# Patient Record
Sex: Male | Born: 1950 | Race: White | Hispanic: No | Marital: Married | Smoking: Never smoker
Health system: Southern US, Community
[De-identification: ages and names within clinical notes are randomized; demographics above are authoritative.]

## PROBLEM LIST (undated history)

## (undated) DIAGNOSIS — K219 Gastro-esophageal reflux disease without esophagitis: Secondary | ICD-10-CM

## (undated) DIAGNOSIS — G4730 Sleep apnea, unspecified: Secondary | ICD-10-CM

## (undated) DIAGNOSIS — Z460 Encounter for fitting and adjustment of spectacles and contact lenses: Secondary | ICD-10-CM

## (undated) DIAGNOSIS — I10 Essential (primary) hypertension: Secondary | ICD-10-CM

## (undated) HISTORY — PX: COLONOSCOPY: SHX174

## (undated) HISTORY — PX: UPPER GASTROINTESTINAL ENDOSCOPY: SHX188

---

## 2001-07-29 HISTORY — PX: ESOPHAGEAL DILATION: SHX303

## 2002-01-08 ENCOUNTER — Encounter: Payer: Self-pay | Admitting: Family Medicine

## 2002-01-08 ENCOUNTER — Ambulatory Visit (HOSPITAL_COMMUNITY): Admission: RE | Admit: 2002-01-08 | Discharge: 2002-01-08 | Payer: Self-pay | Admitting: Family Medicine

## 2004-04-25 ENCOUNTER — Ambulatory Visit (HOSPITAL_COMMUNITY): Admission: RE | Admit: 2004-04-25 | Discharge: 2004-04-25 | Payer: Self-pay | Admitting: Internal Medicine

## 2005-09-02 IMAGING — CR DG ESOPHAGUS
12 of 24 series · 12 of 24 positions shown · non-contrast
Comparison: none

CLINICAL DATA: Epiphrenic diverticulum, dysphagia, abnormal endoscopy.

[run (1 of 12)]
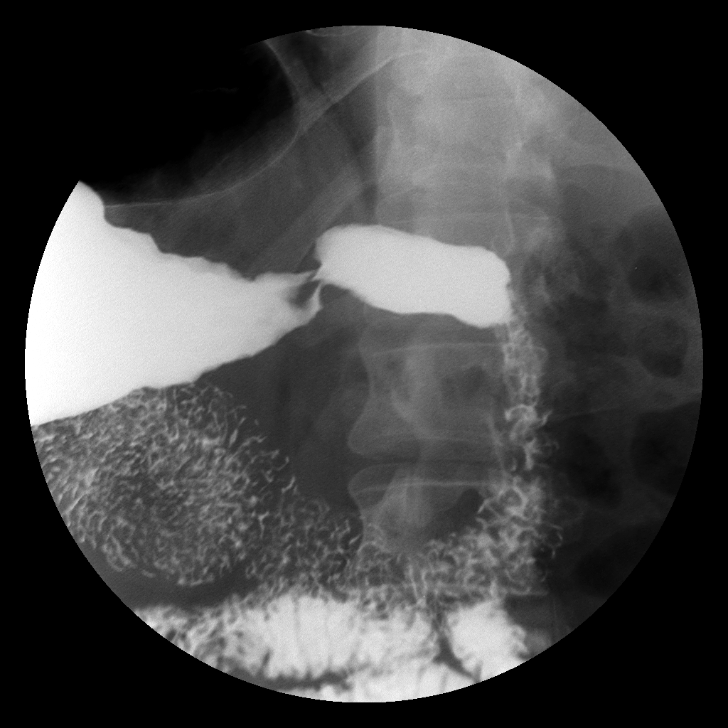

[run (2 of 12)]
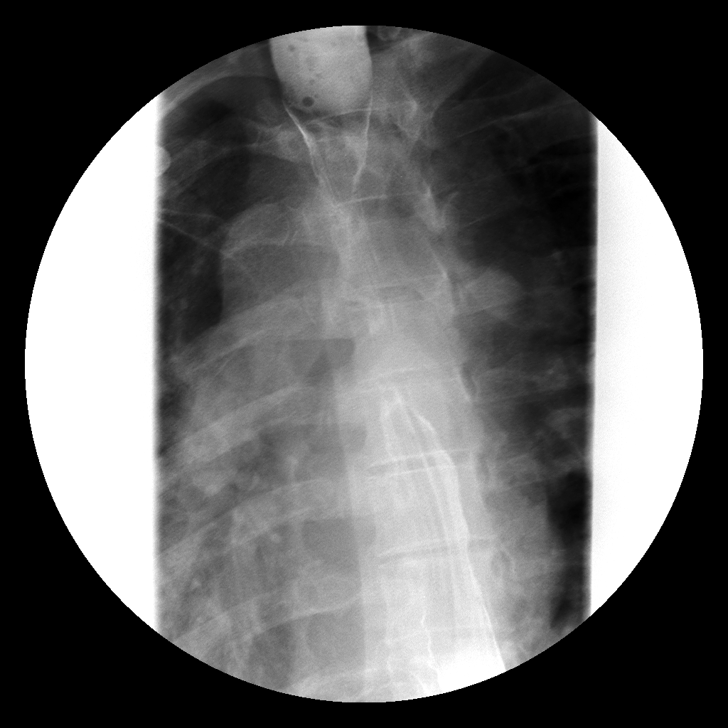

[run (3 of 12)]
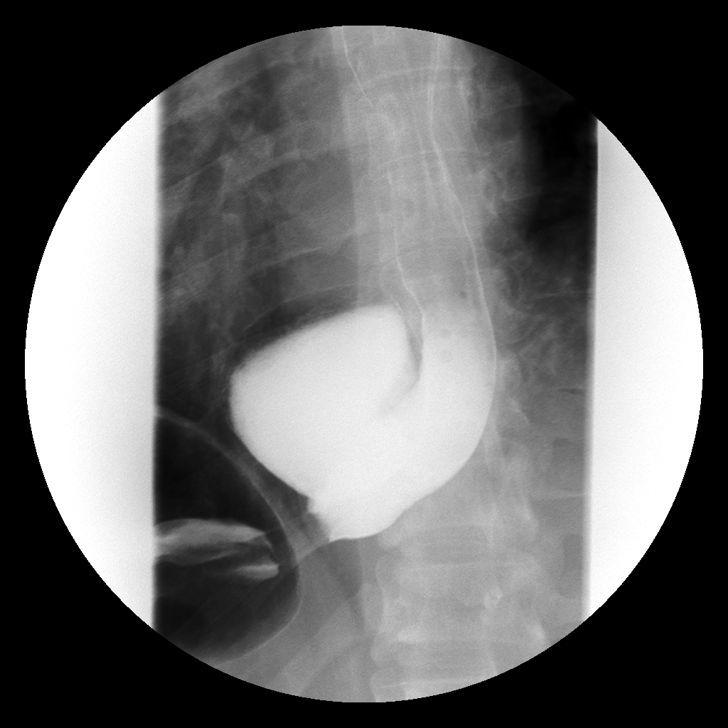

[run (4 of 12)]
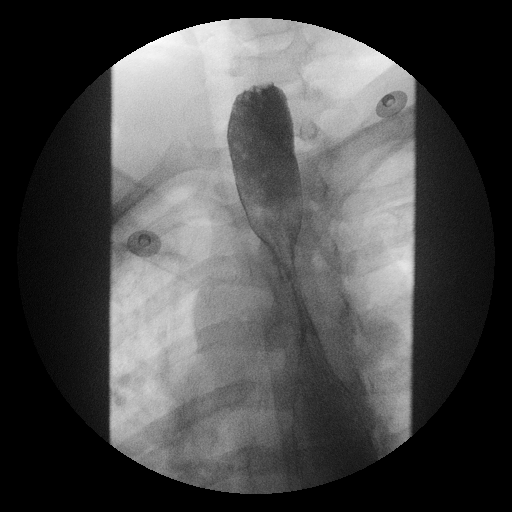

[run (5 of 12)]
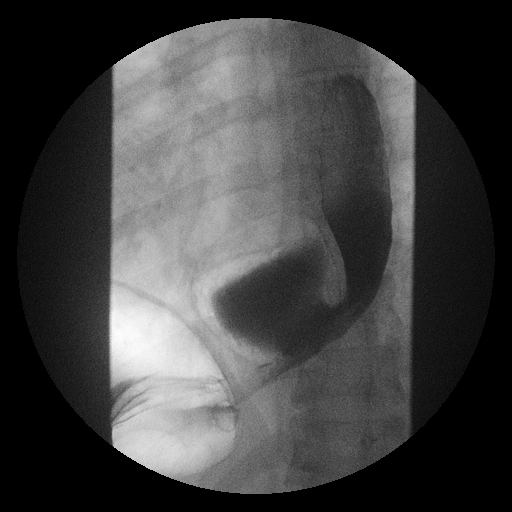

[run (6 of 12)]
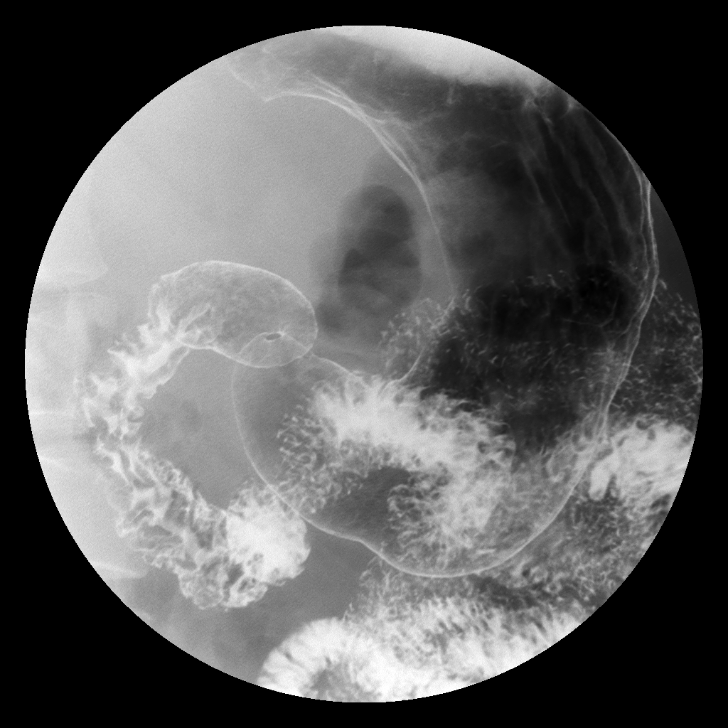

[run (7 of 12)]
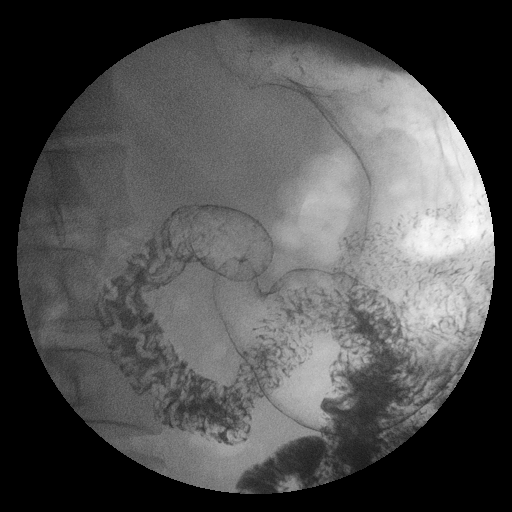

[run (8 of 12)]
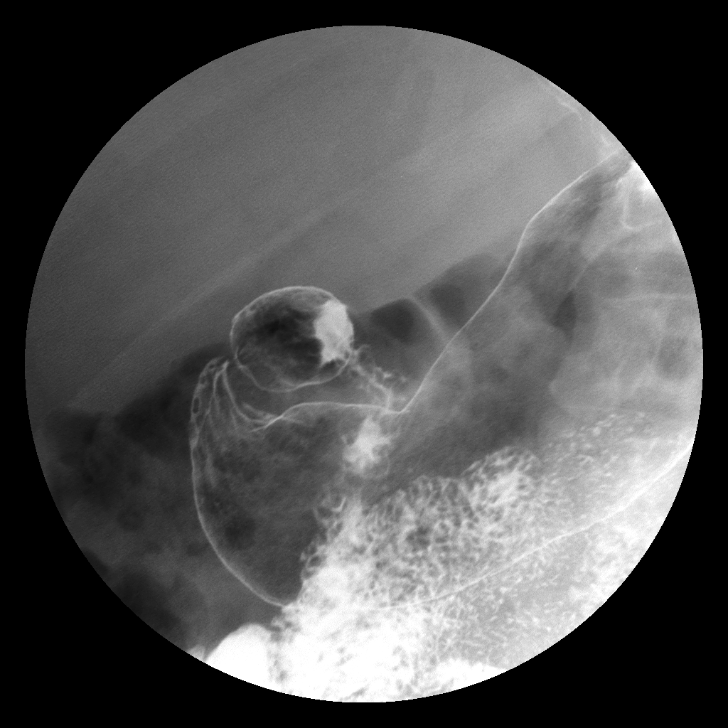

[run (9 of 12)]
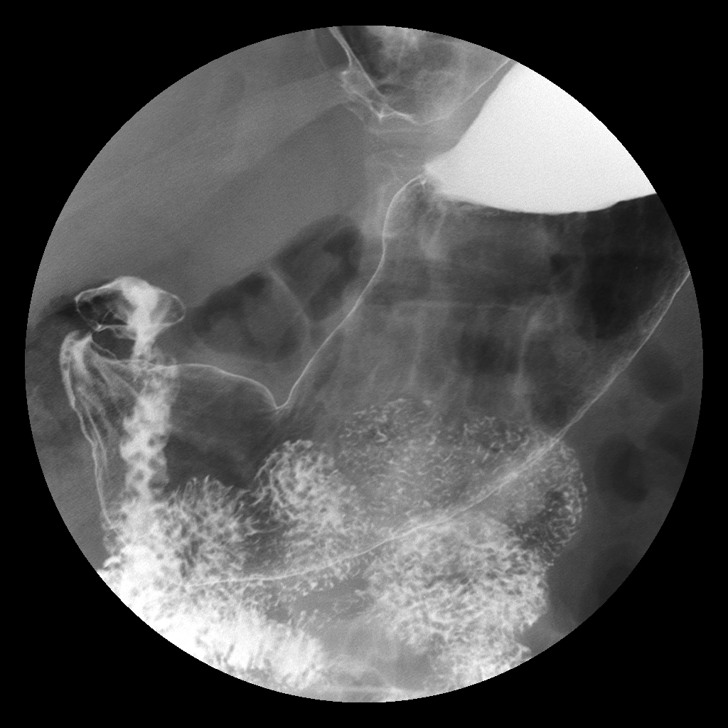

[run (10 of 12)]
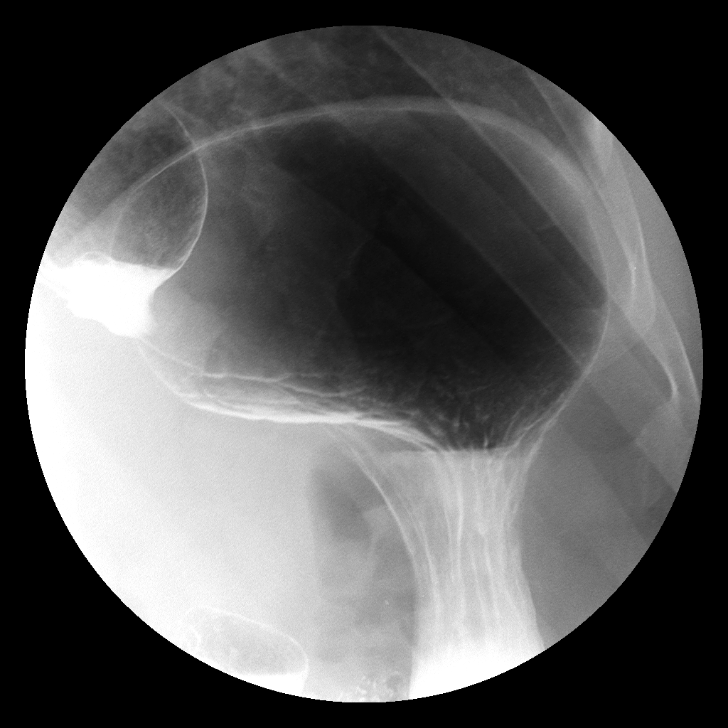

[run (11 of 12)]
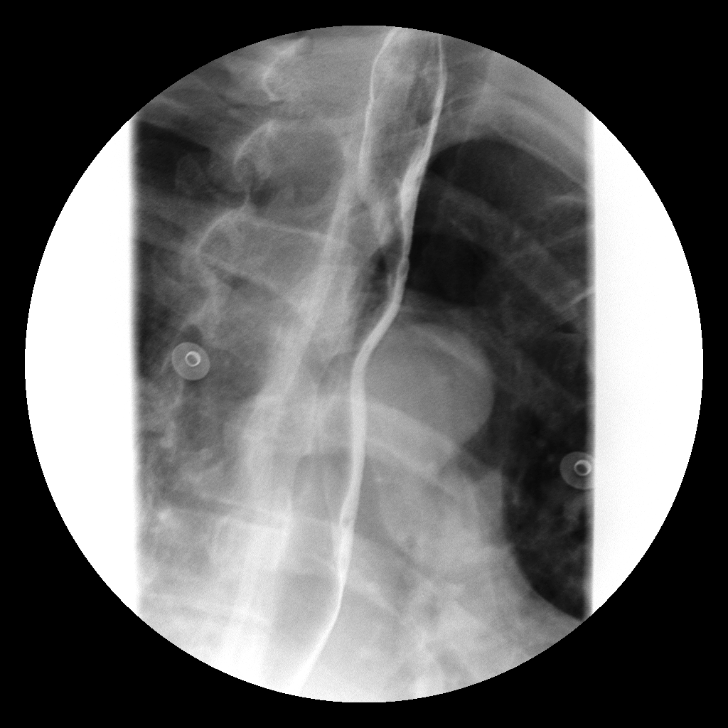

[run (12 of 12)]
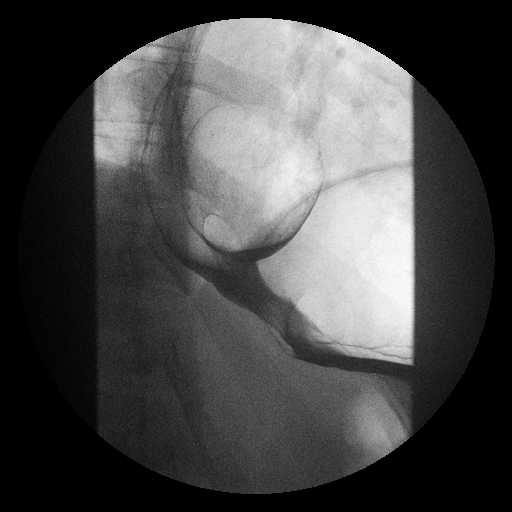

[12 of 24 positions shown; findings below may reference images not displayed]

ESOPHAGRAM

Single contrast esophagram performed.
Esophagus normal in caliber without stricture or obstruction.
12.5 mm diameter barium tablet passes from mouth to stomach without delay.
Large diverticulum distal esophagus, above the diaphragm, in which prominent barium air level is
identified. The barium tablet did not lodge within the diverticulum. No definite retained food
debris within diverticulum.
Mucosa appears smooth and regular and no signs of esophagitis seen.
No intraluminal filling defects within esophagus.
IMPRESSION: Large epiphrenic diverticulum. No esophageal obstruction.

UPPER GI EXAM

Stomach distends without mass or ulceration.
No gastric outlet obstruction.
Normal gastric rugal fold pattern.
Normal appearance duodenal bulb and sweep.
Visualized small bowel loops unremarkable.
IMPRESSION: Normal upper GI exam.

## 2008-07-18 ENCOUNTER — Emergency Department (HOSPITAL_COMMUNITY): Admission: EM | Admit: 2008-07-18 | Discharge: 2008-07-19 | Payer: Self-pay | Admitting: Emergency Medicine

## 2008-07-18 ENCOUNTER — Emergency Department (HOSPITAL_COMMUNITY): Admission: EM | Admit: 2008-07-18 | Discharge: 2008-07-18 | Payer: Self-pay | Admitting: Emergency Medicine

## 2010-12-11 NOTE — Op Note (Signed)
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   MEDICATIONS:  Fentanyl 75 mcg IV, Versed 8 mg IV.   FINDINGS:  Endoscope was inserted.  The oropharynx and esophagus  intubated.  There was scattered areas of foamy substances in the middle  and distal esophagus.  A large food bolus was noted to be blocking the  esophageal lumen of the distal esophagus.  A Roth Net was used to remove  this food bolus.  The endoscope was reinserted and advanced down to the  distal esophagus although there was one linear erythematous streak in  the vicinity of the previously seen food bolus.  There was also a  stricture in this area with two sutures/staples noted in the area of  this stricture.  The endoscope was advanced through the GE junction  without difficulty into the small hiatal hernia and down into the  stomach.  The stomach was normal in appearance.  Retroflexion was done  which revealed normal proximal stomach.  The scope was straightened and  advanced into the duodenal bulb and second portion of duodenum which  were both normal.  Endoscope was withdrawn back into the esophagus and  this distal esophageal stricture was again noted and appeared benign in  appearance.  Two sutures/staples were again visualized in the vicinity  of where the food bolus was.  On further withdrawal of the endoscope, a  nonobstructing circumferential esophageal ring was seen in the proximal  esophagus.  Endoscope was withdrawn to confirm above findings.   ASSESSMENT:  1. Food bolus  removed with food with Lucina Mellow Net in distal esophagus.  2. 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   PLAN:  1. Prilosec OTC 20 mg p.o. daily.  2. If symptoms persist, then the patient most likely will need      referral back to [facility] where he had his surgery since      stricture may not be amenable to endoscopic dilation due to      previous surgery.      Shirley Friar, MD  Electronically Signed     VCS/MEDQ  D:  07/18/2008  T:  07/19/2008  Job:  [id number]   cc:   R. Roetta Sessions, M.D.

## 2010-12-14 NOTE — H&P (Signed)
NAMEDELONTAE, LAMM                          ACCOUNT NO.:  [id number]   MEDICAL RECORD NO.:  [id number]                   PATIENT TYPE:   LOCATION:                                       FACILITY:   PHYSICIAN:  R. Roetta Sessions, M.D.              DATE OF BIRTH:  25-Dec-1950   DATE OF ADMISSION:  DATE OF DISCHARGE:                                HISTORY & PHYSICAL   CHIEF COMPLAINT:  Esophageal dysphagia.   HISTORY OF PRESENT ILLNESS:  This patient is a 60 year old Caucasian male,  [employer] stage manager, who came to see me with a 1-year history of  intermittent esophageal dysphagia to solids.  He describes having particular  difficulties with steak on an intermittent basis.  Sometimes liquids in  association with episodes of esophageal solid food dysphagia, no trouble  with pills.  Really has not had any reflux symptoms.  No true odynophagia.  Has not had any early satiety, nausea or vomiting  No melena or rectal  bleeding.  No weight loss.  No history of prior gastrointestinal disease.  He does not smoke or use tobacco products. Again, he has not lost any  weight.   PAST MEDICAL HISTORY:  Unremarkable except for high blood pressure.   CURRENT MEDICATIONS:  Hydrochlorothiazide 25 mg daily, SA daily, Lotrel caps  10/20 mg daily.   ALLERGIES:  No known drug allergies.   FAMILY HISTORY:  His father died of myocardial infarction.  Mother has  diabetes.  No history of chronic GI or liver illness.   SOCIAL HISTORY:  The patient is married has 2 step-sons.  He is a Engineer, maintenance.  No tobacco, no alcohol.   REVIEW OF SYSTEMS:  No fever, no chills, no change in weight.  No chest pain  or dyspnea.   PHYSICAL EXAMINATION:  GENERAL:  Reveals a pleasant, 60 year old gentleman  resting comfortably in no acute distress.  VITAL SIGNS:  Height 5 feet 9 inches.  BP 130/90, pulse 80.  SKIN:  Warm and dry.  HEENT:  No scleral icterus.  Oral cavity no lesions.  CHEST:  Lungs are  clear to auscultation.  CARDIAC EXAM:  Regular rate and rhythm without murmur, gallop, or rub.  ABDOMEN:  Nondistended, positive bowel sounds.  Soft, nontender, without  appreciable mass or organomegaly.   IMPRESSION:  This patient is a 60 year old gentleman with intermittent  esophageal dysphagia.  He most certainly has a Schatzki's ring, a peptic  stricture, or cervical web would be less likely in the differential.  There  is really no tobacco or alcohol history so a malignant process would be much  less likely.   RECOMMENDATIONS:  We talked about barium study versus going straight to  endoscopy.  The potential risks, benefits, and alternatives have been  reviewed.  We mutually agreed to go ahead and proceed with an EGD with  esophageal dilation, as appropriate, in the near  future at [facility].  The potential risks, benefits, and alternatives have been  reviewed.  His questions were answered.  He is agreeable. At some point,  later in the year, he is at the 60 threshold age for colorectal cancer  screening and we will discuss screening colonoscopy with him soon.   Further recommendations to follow.     ___________________________________________                                         Jonathon Bellows, M.D.   RMR/MEDQ  D:  04/11/2004  T:  04/11/2004  Job:  [id number]   cc:   [facility]   R. Roetta Sessions, M.D.  [address]  [place]  [place] [address]  Fax: [phone]

## 2010-12-14 NOTE — Op Note (Signed)
[name], Fred              ACCOUNT NO.:  [id number]   MEDICAL RECORD NO.:  [id number]          PATIENT TYPE:  AMB   LOCATION:  DAY                           FACILITY:  [facility]   PHYSICIAN:  R. Roetta Sessions, M.D. DATE OF BIRTH:  31-May-1951   DATE OF PROCEDURE:  04/25/2004  DATE OF DISCHARGE:                                 OPERATIVE REPORT   PROCEDURE:  Esophagogastroduodenoscopy with biopsy.   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  EGD  is now being done.  This approach has been discussed with the patient at  length.  Potential risks, benefits, and alternatives have been reviewed,  questions answered.  Please see my dictated consultation note of April 11, 2004, for more information.   PROCEDURE NOTE:  O2 saturation, blood pressure, pulse, and respirations were  monitored throughout the entire procedure.   CONSCIOUS SEDATION:  Versed 3 mg IV, Demerol 75 mg IV in divided doses,  Cetacaine spray for topical pharyngeal anesthesia.   INSTRUMENT USED:  Olympus video chip system.   FINDINGS:  Examination of the tubular esophagitis revealed a noncritical  proximal esophageal ring 24 cm in from the incisors, please see photos.  Examination of the remainder of the tubular esophagus revealed a large  epiphrenic diverticulum at the EG diverticulum.  There was also a 1 cm  sessile adenomatous-appearing nodule at the EG junction.  The mucosa  otherwise appeared normal.  The EG junction itself was easily traversed with  the scope.   Stomach:  The gastric cavity was emptied and insufflated well with air.  Thorough examination of the gastric mucosa including retroflexed view of the  proximal stomach, esophagogastric junction demonstrated only a small hiatal  hernia.  Pylorus patent and easily traversed.  Examination of the bulb and  second portion revealed no abnormalities.   Therapeutic/diagnostic maneuvers performed:  The adenomatous-appearing  nodule at the EG junction was biopsied multiple times.  The esophagus was  not dilated, as it was felt the epiphrenic diverticulum was largely  responsible for this patient's dysphagia.  The patient tolerated the  procedure well and was reacted in endoscopy.   IMPRESSION:  1.  Noncritical-appearing proximal esophageal ring, not manipulated.  2.  Large outpouching of the distal esophagus consistent with an epiphrenic      diverticulum, likely responsible for patient's symptoms.  3.  A 1 cm sessile adenomatous-appearing nodule of the esophagogastric      junction of uncertain significance, biopsied.  4.  Small hiatal hernia, otherwise normal stomach, normal D1, D2.   RECOMMENDATIONS:  1.  Will go ahead and get a barium pill esophagogram/upper GI series at a      later date to get a better road map.  2.  Follow up on pathology.  3.  It is certainly possible this patient could have achalasia leading to      the epiphrenic diverticulum.  At any rate, he will ultimately need      surgical intervention with possible manometry at a tertiary referral  center in the near future.     Otelia Sergeant   RMR/MEDQ  D:  04/25/2004  T:  04/25/2004  Job:  [id number]   cc:   Patrica Duel, M.D.  [address]  [place]  [place] [address]  Fax: [phone]

## 2011-12-26 NOTE — H&P (Signed)
  NTS SOAP Note  Vital Signs:  Vitals as of: 12/26/2011: Systolic 150: Diastolic 93: Heart Rate 73: Temp 98.22F: Height 65ft 10in: Weight 226Lbs 0 Ounces: Pain Level 2: BMI 32  BMI : 32.43 kg/m2  Subjective: This 82 Years 34 Months old Male presents for of    HERNIA: ,Present with a right inguinal hernia.  Has been present for one month.  Made worse with straining.  Is uncomfortable when sticking out.  Review of Symptoms:  Constitutional:unremarkable   Head:unremarkable    Eyes:unremarkable   Nose/Mouth/Throat:unremarkable Cardiovascular:  unremarkable   Respiratory:unremarkable   Gastrointestinal:  unremarkable   Genitourinary:unremarkable     Musculoskeletal:unremarkable   Skin:unremarkable Hematolgic/Lymphatic:unremarkable     Allergic/Immunologic:unremarkable     Past Medical History:    Reviewed   Past Medical History  Surgical History: unknown Medical Problems: HTN, GERD Allergies: nkda Medications: exforge, prilosec otc, baby asa (holding)   Social History:Reviewed  Social History  Preferred Language: English ([place]) Race:  White Ethnicity: Not Hispanic / Latino Age: 9 Years 7 Months Marital Status:  M Alcohol:  No Recreational drug(s):  No   Smoking Status: Never smoker reviewed on 12/26/2011  Family History:  Reviewed   Family History  Is there a family history ZO:[id number]    Objective Information: General:  Well appearing, well nourished in no distress. Head:Atraumatic; no masses; no abnormalities Heart:  RRR, no murmur Lungs:    CTA bilaterally, no wheezes, rhonchi, rales.  Breathing unlabored. Abdomen:Soft, NT/ND, no HSM, no masses.  Reducible right inguinal hernia.  No left inguinal hernia. XB:[id number]    Assessment:Right inguinal hernia  Diagnosis &amp; Procedure: DiagnosisCode: 550.90, ProcedureCode: 86578,    Plan:Scheduled for right inguinal  herniorrhaphy on 01/03/12.   Patient Education:Alternative treatments to surgery were discussed with patient (and family).  Risks and benefits  of procedure were fully explained to the patient (and family) who gave informed consent. Patient/family questions were addressed.  Follow-up:Pending Surgery

## 2011-12-31 ENCOUNTER — Other Ambulatory Visit (HOSPITAL_COMMUNITY): Payer: Self-pay

## 2012-01-01 ENCOUNTER — Encounter (HOSPITAL_COMMUNITY): Payer: PRIVATE HEALTH INSURANCE

## 2012-01-03 ENCOUNTER — Ambulatory Visit (HOSPITAL_COMMUNITY)
Admission: RE | Admit: 2012-01-03 | Payer: PRIVATE HEALTH INSURANCE | Source: Ambulatory Visit | Admitting: General Surgery

## 2012-01-03 ENCOUNTER — Encounter (HOSPITAL_COMMUNITY): Admission: RE | Payer: Self-pay | Source: Ambulatory Visit

## 2012-01-03 SURGERY — REPAIR, HERNIA, INGUINAL, ADULT
Anesthesia: General | Laterality: Right

## 2012-01-28 ENCOUNTER — Ambulatory Visit (INDEPENDENT_AMBULATORY_CARE_PROVIDER_SITE_OTHER): Payer: PRIVATE HEALTH INSURANCE | Admitting: Surgery

## 2012-01-29 ENCOUNTER — Ambulatory Visit (INDEPENDENT_AMBULATORY_CARE_PROVIDER_SITE_OTHER): Payer: PRIVATE HEALTH INSURANCE | Admitting: Surgery

## 2012-01-29 ENCOUNTER — Encounter (INDEPENDENT_AMBULATORY_CARE_PROVIDER_SITE_OTHER): Payer: Self-pay | Admitting: Surgery

## 2012-01-29 VITALS — BP 162/102 | HR 77 | Temp 98.2°F | Ht 69.5 in | Wt 228.2 lb

## 2012-01-29 DIAGNOSIS — K4090 Unilateral inguinal hernia, without obstruction or gangrene, not specified as recurrent: Secondary | ICD-10-CM | POA: Insufficient documentation

## 2012-01-29 NOTE — Progress Notes (Signed)
Patient ID: Fred [name], male   DOB: 12-18-1950, 61 y.o.   MRN: [id number]  Chief Complaint  Patient presents with  . Pre-op Exam    eval Rt groin pain    HPI Fred [name] is a 61 y.o. male. This is a pleasant gentleman referred by Dr. Phillips Odor for evaluation of a symptomatic right inguinal hernia. He was lifting a heavy object off proximally 7 weeks ago when he had sharp right groin pain and then noticed a bulge. Since then the pain has regressed. He still has an intermittent bulge was easily reduces. He has no nausea, vomiting, or obstructive symptoms. The pain is mild and is not referring where else. He is otherwise without complaints. HPI  History reviewed. No pertinent past medical history.  Past Surgical History  Procedure Date  . Esophageal dilation 2003    Family History  Problem Relation Age of Onset  . Cancer Father     colon/prostate  . Heart disease Father     Social History History  Substance Use Topics  . Smoking status: Never Smoker   . Smokeless tobacco: Not on file  . Alcohol Use: No    No Known Allergies  Current Outpatient Prescriptions  Medication Sig Dispense Refill  . amLODipine-valsartan (EXFORGE) 10-320 MG per tablet Take 1 tablet by mouth daily.      . fish oil-omega-3 fatty acids 1000 MG capsule Take 2 g by mouth daily.      . Multiple Vitamins-Minerals (MULTIVITAMIN WITH MINERALS) tablet Take 1 tablet by mouth daily.      Marland Kitchen omeprazole (PRILOSEC) 40 MG capsule Take 40 mg by mouth daily.      Marland Kitchen aspirin 81 MG tablet Take 81 mg by mouth daily.        Review of Systems Review of Systems  Constitutional: Negative for fever, chills and unexpected weight change.  HENT: Negative for hearing loss, congestion, sore throat, trouble swallowing and voice change.   Eyes: Negative for visual disturbance.  Respiratory: Negative for cough and wheezing.   Cardiovascular: Negative for chest pain, palpitations and leg swelling.  Gastrointestinal:  Negative for nausea, vomiting, abdominal pain, diarrhea, constipation, blood in stool, abdominal distention, anal bleeding and rectal pain.  Genitourinary: Negative for hematuria and difficulty urinating.  Musculoskeletal: Negative for arthralgias.  Skin: Negative for rash and wound.  Neurological: Negative for seizures, syncope, weakness and headaches.  Hematological: Negative for adenopathy. Does not bruise/bleed easily.  Psychiatric/Behavioral: Negative for confusion.    Blood pressure 162/102, pulse 77, temperature 98.2 F (36.8 C), temperature source Temporal, height 5' 9.5" (1.765 m), weight 228 lb 3.2 oz (103.511 kg), SpO2 97.00%.  Physical Exam Physical Exam  Constitutional: He is oriented to person, place, and time. He appears well-developed and well-nourished. No distress.  HENT:  Head: Normocephalic and atraumatic.  Right Ear: External ear normal.  Left Ear: External ear normal.  Nose: Nose normal.  Mouth/Throat: Oropharynx is clear and moist. No oropharyngeal exudate.  Eyes: Conjunctivae and EOM are normal. Pupils are equal, round, and reactive to light. No scleral icterus.  Neck: Normal range of motion. Neck supple. No tracheal deviation present. No thyromegaly present.  Cardiovascular: Normal rate, regular rhythm, normal heart sounds and intact distal pulses.   No murmur heard. Pulmonary/Chest: Effort normal and breath sounds normal. No respiratory distress. He has no wheezes. He has no rales.  Abdominal: Bowel sounds are normal. He exhibits no distension. There is no tenderness. There is no rebound.  There is an easily reducible right inguinal hernia. There is no evidence of umbilical hernia or left inguinal hernia  Musculoskeletal: Normal range of motion. He exhibits no edema and no tenderness.  Lymphadenopathy:    He has no cervical adenopathy.  Neurological: He is alert and oriented to person, place, and time. No cranial nerve deficit. Coordination normal.    Skin: Skin is warm and dry. No rash noted. He is not diaphoretic. No erythema.  Psychiatric: His behavior is normal. Judgment normal.    Data Reviewed I have the notes from his primary care physician which I have reviewed  Assessment    Right inguinal hernia    Plan    Repair with mesh was recommended. He is already requesting a laparoscopic repair. I discussed this with him and his wife in detail. I discussed open repair as well. I discussed the risks of surgery which includes but is not limited to bleeding, infection, injury to surrounding structures, chronic pain, nerve entrapment, recurrence, etc. He understands and wishes to proceed with laparoscopic repair. Surgery will be scheduled. Likelihood of success is good       Lyndon Chenoweth A 01/29/2012, 2:17 PM

## 2012-02-13 MED ORDER — BACITRACIN ZINC 500 UNIT/GM EX OINT
TOPICAL_OINTMENT | CUTANEOUS | Status: AC
  Filled 2012-02-13: qty 15

## 2012-02-28 ENCOUNTER — Encounter (HOSPITAL_BASED_OUTPATIENT_CLINIC_OR_DEPARTMENT_OTHER): Payer: Self-pay | Admitting: *Deleted

## 2012-02-28 NOTE — Progress Notes (Signed)
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

## 2012-03-03 ENCOUNTER — Encounter (HOSPITAL_BASED_OUTPATIENT_CLINIC_OR_DEPARTMENT_OTHER)
Admission: RE | Admit: 2012-03-03 | Discharge: 2012-03-03 | Disposition: A | Discharge status: Home / Self Care | Payer: PRIVATE HEALTH INSURANCE | Source: Ambulatory Visit | Attending: Surgery | Admitting: Surgery

## 2012-03-03 LAB — BASIC METABOLIC PANEL
CO2: 26 mEq/L (ref 19–32)
Calcium: 9 mg/dL (ref 8.4–10.5)
Chloride: 106 mEq/L (ref 96–112)
Potassium: 3.3 mEq/L — ABNORMAL LOW (ref 3.5–5.1)
Sodium: 141 mEq/L (ref 135–145)

## 2012-03-03 LAB — DIFFERENTIAL
Eosinophils Relative: 5 % (ref 0–5)
Lymphocytes Relative: 29 % (ref 12–46)
Lymphs Abs: 1.7 10*3/uL (ref 0.7–4.0)
Neutrophils Relative %: 52 % (ref 43–77)

## 2012-03-03 LAB — CBC
Hemoglobin: 15.2 g/dL (ref 13.0–17.0)
MCV: 89.4 fL (ref 78.0–100.0)
Platelets: 238 10*3/uL (ref 150–400)
RBC: 4.9 MIL/uL (ref 4.22–5.81)
WBC: 5.9 10*3/uL (ref 4.0–10.5)

## 2012-03-04 ENCOUNTER — Encounter (HOSPITAL_BASED_OUTPATIENT_CLINIC_OR_DEPARTMENT_OTHER): Payer: Self-pay | Admitting: *Deleted

## 2012-03-04 NOTE — H&P (Signed)
Patient ID: Fred [name], male DOB: [birth year], 61 y.o. MRN: [id number]  Chief Complaint   Patient presents with   .  Pre-op Exam     eval Rt groin pain    HPI  Fred [name] is a 61 y.o. male. This is a pleasant gentleman referred by Dr. Phillips Odor for evaluation of a symptomatic right inguinal hernia. He was lifting a heavy object off proximally 7 weeks ago when he had sharp right groin pain and then noticed a bulge. Since then the pain has regressed. He still has an intermittent bulge was easily reduces. He has no nausea, vomiting, or obstructive symptoms. The pain is mild and is not referring where else. He is otherwise without complaints.  HPI  History reviewed. No pertinent past medical history.  Past Surgical History   Procedure  Date   .  Esophageal dilation  2003    Family History   Problem  Relation  Age of Onset   .  Cancer  Father       colon/prostate    .  Heart disease  Father     Social History  History   Substance Use Topics   .  Smoking status:  Never Smoker   .  Smokeless tobacco:  Not on file   .  Alcohol Use:  No    No Known Allergies  Current Outpatient Prescriptions   Medication  Sig  Dispense  Refill   .  amLODipine-valsartan (EXFORGE) 10-320 MG per tablet  Take 1 tablet by mouth daily.     .  fish oil-omega-3 fatty acids 1000 MG capsule  Take 2 g by mouth daily.     .  Multiple Vitamins-Minerals (MULTIVITAMIN WITH MINERALS) tablet  Take 1 tablet by mouth daily.     Marland Kitchen  omeprazole (PRILOSEC) 40 MG capsule  Take 40 mg by mouth daily.     Marland Kitchen  aspirin 81 MG tablet  Take 81 mg by mouth daily.      Review of Systems  Review of Systems  Constitutional: Negative for fever, chills and unexpected weight change.  HENT: Negative for hearing loss, congestion, sore throat, trouble swallowing and voice change.  Eyes: Negative for visual disturbance.  Respiratory: Negative for cough and wheezing.  Cardiovascular: Negative for chest pain, palpitations and leg  swelling.  Gastrointestinal: Negative for nausea, vomiting, abdominal pain, diarrhea, constipation, blood in stool, abdominal distention, anal bleeding and rectal pain.  Genitourinary: Negative for hematuria and difficulty urinating.  Musculoskeletal: Negative for arthralgias.  Skin: Negative for rash and wound.  Neurological: Negative for seizures, syncope, weakness and headaches.  Hematological: Negative for adenopathy. Does not bruise/bleed easily.  Psychiatric/Behavioral: Negative for confusion.   Blood pressure 162/102, pulse 77, temperature 98.2 F (36.8 C), temperature source Temporal, height 5' 9.5" (1.765 m), weight 228 lb 3.2 oz (103.511 kg), SpO2 97.00%.  Physical Exam  Physical Exam  Constitutional: He is oriented to person, place, and time. He appears well-developed and well-nourished. No distress.  HENT:  Head: Normocephalic and atraumatic.  Right Ear: External ear normal.  Left Ear: External ear normal.  Nose: Nose normal.  Mouth/Throat: Oropharynx is clear and moist. No oropharyngeal exudate.  Eyes: Conjunctivae and EOM are normal. Pupils are equal, round, and reactive to light. No scleral icterus.  Neck: Normal range of motion. Neck supple. No tracheal deviation present. No thyromegaly present.  Cardiovascular: Normal rate, regular rhythm, normal heart sounds and intact distal pulses.  No murmur heard.  Pulmonary/Chest: Effort  normal and breath sounds normal. No respiratory distress. He has no wheezes. He has no rales.  Abdominal: Bowel sounds are normal. He exhibits no distension. There is no tenderness. There is no rebound.  There is an easily reducible right inguinal hernia. There is no evidence of umbilical hernia or left inguinal hernia  Musculoskeletal: Normal range of motion. He exhibits no edema and no tenderness.  Lymphadenopathy:  He has no cervical adenopathy.  Neurological: He is alert and oriented to person, place, and time. No cranial nerve deficit.  Coordination normal.  Skin: Skin is warm and dry. No rash noted. He is not diaphoretic. No erythema.  Psychiatric: His behavior is normal. Judgment normal.   Data Reviewed  I have the notes from his primary care physician which I have reviewed  Assessment   Right inguinal hernia   Plan   Repair with mesh was recommended. He is already requesting a laparoscopic repair. I discussed this with him and his wife in detail. I discussed open repair as well. I discussed the risks of surgery which includes but is not limited to bleeding, infection, injury to surrounding structures, chronic pain, nerve entrapment, recurrence, etc. He understands and wishes to proceed with laparoscopic repair. Surgery will be scheduled. Likelihood of success is good   Mantaj Chamberlin A

## 2012-03-05 ENCOUNTER — Encounter (HOSPITAL_BASED_OUTPATIENT_CLINIC_OR_DEPARTMENT_OTHER): Payer: Self-pay | Admitting: *Deleted

## 2012-03-05 ENCOUNTER — Encounter (HOSPITAL_BASED_OUTPATIENT_CLINIC_OR_DEPARTMENT_OTHER): Payer: Self-pay | Admitting: Anesthesiology

## 2012-03-05 ENCOUNTER — Ambulatory Visit (HOSPITAL_BASED_OUTPATIENT_CLINIC_OR_DEPARTMENT_OTHER): Payer: PRIVATE HEALTH INSURANCE | Admitting: Certified Registered Nurse Anesthetist

## 2012-03-05 ENCOUNTER — Encounter (HOSPITAL_BASED_OUTPATIENT_CLINIC_OR_DEPARTMENT_OTHER): Payer: Self-pay | Admitting: Certified Registered Nurse Anesthetist

## 2012-03-05 ENCOUNTER — Ambulatory Visit (HOSPITAL_BASED_OUTPATIENT_CLINIC_OR_DEPARTMENT_OTHER)
Admission: RE | Admit: 2012-03-05 | Discharge: 2012-03-05 | Disposition: A | Discharge status: Home / Self Care | Payer: PRIVATE HEALTH INSURANCE | Source: Ambulatory Visit | Attending: Surgery | Admitting: Surgery

## 2012-03-05 ENCOUNTER — Encounter (HOSPITAL_BASED_OUTPATIENT_CLINIC_OR_DEPARTMENT_OTHER)
Admission: RE | Disposition: A | Discharge status: Home / Self Care | Payer: Self-pay | Source: Ambulatory Visit | Attending: Surgery

## 2012-03-05 DIAGNOSIS — K4090 Unilateral inguinal hernia, without obstruction or gangrene, not specified as recurrent: Secondary | ICD-10-CM

## 2012-03-05 DIAGNOSIS — G4730 Sleep apnea, unspecified: Secondary | ICD-10-CM | POA: Insufficient documentation

## 2012-03-05 DIAGNOSIS — I10 Essential (primary) hypertension: Secondary | ICD-10-CM | POA: Insufficient documentation

## 2012-03-05 DIAGNOSIS — K219 Gastro-esophageal reflux disease without esophagitis: Secondary | ICD-10-CM | POA: Insufficient documentation

## 2012-03-05 HISTORY — PX: INGUINAL HERNIA REPAIR: SHX194

## 2012-03-05 HISTORY — DX: Sleep apnea, unspecified: G47.30

## 2012-03-05 HISTORY — DX: Gastro-esophageal reflux disease without esophagitis: K21.9

## 2012-03-05 HISTORY — DX: Essential (primary) hypertension: I10

## 2012-03-05 HISTORY — DX: Encounter for fitting and adjustment of spectacles and contact lenses: Z46.0

## 2012-03-05 SURGERY — REPAIR, HERNIA, INGUINAL, LAPAROSCOPIC
Anesthesia: General | Site: Groin | Laterality: Right | Wound class: Clean

## 2012-03-05 MED ORDER — MIDAZOLAM HCL 2 MG/2ML IJ SOLN
1.0000 mg | INTRAMUSCULAR | Status: DC | PRN
  Administered 2012-03-05: 2 mg via INTRAVENOUS

## 2012-03-05 MED ORDER — GLYCOPYRROLATE 0.2 MG/ML IJ SOLN
INTRAMUSCULAR | Status: DC | PRN
  Administered 2012-03-05: 0.4 mg via INTRAVENOUS

## 2012-03-05 MED ORDER — LACTATED RINGERS IV SOLN
INTRAVENOUS | Status: DC
  Administered 2012-03-05 (×2): via INTRAVENOUS

## 2012-03-05 MED ORDER — KETOROLAC TROMETHAMINE 30 MG/ML IJ SOLN
INTRAMUSCULAR | Status: DC | PRN
  Administered 2012-03-05: 30 mg via INTRAVENOUS

## 2012-03-05 MED ORDER — HYDROCODONE-ACETAMINOPHEN 5-325 MG PO TABS: 1.0000 | ORAL_TABLET | ORAL | Status: AC | PRN

## 2012-03-05 MED ORDER — METOCLOPRAMIDE HCL 5 MG/ML IJ SOLN
10.0000 mg | Freq: Once | INTRAMUSCULAR | Status: AC | PRN
  Administered 2012-03-05: 10 mg via INTRAVENOUS

## 2012-03-05 MED ORDER — FENTANYL CITRATE 0.05 MG/ML IJ SOLN
INTRAMUSCULAR | Status: DC | PRN
  Administered 2012-03-05: 50 ug via INTRAVENOUS

## 2012-03-05 MED ORDER — ONDANSETRON HCL 4 MG/2ML IJ SOLN
INTRAMUSCULAR | Status: DC | PRN
  Administered 2012-03-05: 4 mg via INTRAVENOUS

## 2012-03-05 MED ORDER — PROPOFOL 10 MG/ML IV EMUL
INTRAVENOUS | Status: DC | PRN
  Administered 2012-03-05: 50 mg via INTRAVENOUS
  Administered 2012-03-05: 250 mg via INTRAVENOUS

## 2012-03-05 MED ORDER — CEFAZOLIN SODIUM-DEXTROSE 2-3 GM-% IV SOLR: 2.0000 g | INTRAVENOUS | Status: DC

## 2012-03-05 MED ORDER — ROCURONIUM BROMIDE 100 MG/10ML IV SOLN
INTRAVENOUS | Status: DC | PRN
  Administered 2012-03-05: 35 mg via INTRAVENOUS

## 2012-03-05 MED ORDER — SCOPOLAMINE 1 MG/3DAYS TD PT72
1.0000 | MEDICATED_PATCH | TRANSDERMAL | Status: DC
  Administered 2012-03-05: 1.5 mg via TRANSDERMAL

## 2012-03-05 MED ORDER — CEFAZOLIN SODIUM 1-5 GM-% IV SOLN
INTRAVENOUS | Status: DC | PRN
  Administered 2012-03-05: 2 g via INTRAVENOUS

## 2012-03-05 MED ORDER — BUPIVACAINE HCL (PF) 0.5 % IJ SOLN
INTRAMUSCULAR | Status: DC | PRN
  Administered 2012-03-05: 20 mL

## 2012-03-05 MED ORDER — FENTANYL CITRATE 0.05 MG/ML IJ SOLN
50.0000 ug | INTRAMUSCULAR | Status: DC | PRN
  Administered 2012-03-05: 100 ug via INTRAVENOUS

## 2012-03-05 MED ORDER — DEXAMETHASONE SODIUM PHOSPHATE 4 MG/ML IJ SOLN
INTRAMUSCULAR | Status: DC | PRN
  Administered 2012-03-05: 10 mg via INTRAVENOUS

## 2012-03-05 MED ORDER — CHLORHEXIDINE GLUCONATE 4 % EX LIQD: 1.0000 "application " | Freq: Once | CUTANEOUS | Status: DC

## 2012-03-05 MED ORDER — NEOSTIGMINE METHYLSULFATE 1 MG/ML IJ SOLN
INTRAMUSCULAR | Status: DC | PRN
  Administered 2012-03-05: 3 mg via INTRAVENOUS

## 2012-03-05 MED ORDER — BUPIVACAINE HCL (PF) 0.25 % IJ SOLN
INTRAMUSCULAR | Status: DC | PRN
  Administered 2012-03-05: 20 mL

## 2012-03-05 MED ORDER — ACETAMINOPHEN 10 MG/ML IV SOLN
1000.0000 mg | Freq: Once | INTRAVENOUS | Status: AC
  Administered 2012-03-05: 1000 mg via INTRAVENOUS

## 2012-03-05 SURGICAL SUPPLY — 33 items
APL SKNCLS STERI-STRIP NONHPOA (GAUZE/BANDAGES/DRESSINGS) ×1
APPLIER CLIP LOGIC TI 5 (MISCELLANEOUS) IMPLANT
APR CLP MED LRG 33X5 (MISCELLANEOUS)
BANDAGE ADHESIVE 1X3 (GAUZE/BANDAGES/DRESSINGS) ×6 IMPLANT
BENZOIN TINCTURE PRP APPL 2/3 (GAUZE/BANDAGES/DRESSINGS) ×2 IMPLANT
BLADE SURG ROTATE 9660 (MISCELLANEOUS) ×1 IMPLANT
CANISTER SUCTION 2500CC (MISCELLANEOUS) IMPLANT
CHLORAPREP W/TINT 26ML (MISCELLANEOUS) ×2 IMPLANT
CLOTH BEACON ORANGE TIMEOUT ST (SAFETY) ×2 IMPLANT
DEVICE SECURE STRAP 25 ABSORB (INSTRUMENTS) ×1 IMPLANT
DISSECT BALLN SPACEMKR + OVL (BALLOONS) ×2
DISSECTOR BALLN SPACEMKR + OVL (BALLOONS) ×1 IMPLANT
DISSECTOR BLUNT TIP ENDO 5MM (MISCELLANEOUS) IMPLANT
ELECT REM PT RETURN 9FT ADLT (ELECTROSURGICAL) ×2
ELECTRODE REM PT RTRN 9FT ADLT (ELECTROSURGICAL) ×1 IMPLANT
GLOVE BIO SURGEON STRL SZ7 (GLOVE) ×1 IMPLANT
GLOVE BIOGEL PI IND STRL 7.0 (GLOVE) IMPLANT
GLOVE BIOGEL PI INDICATOR 7.0 (GLOVE) ×1
GLOVE SURG SIGNA 7.5 PF LTX (GLOVE) ×2 IMPLANT
GOWN PREVENTION PLUS XLARGE (GOWN DISPOSABLE) ×4 IMPLANT
GOWN PREVENTION PLUS XXLARGE (GOWN DISPOSABLE) ×1 IMPLANT
MESH 3DMAX 4X6 RT LRG (Mesh General) ×1 IMPLANT
NDL INSUFFLATION 14GA 120MM (NEEDLE) IMPLANT
NEEDLE INSUFFLATION 14GA 120MM (NEEDLE) IMPLANT
NS IRRIG 1000ML POUR BTL (IV SOLUTION) ×1 IMPLANT
PACK BASIN DAY SURGERY FS (CUSTOM PROCEDURE TRAY) ×2 IMPLANT
SET IRRIG TUBING LAPAROSCOPIC (IRRIGATION / IRRIGATOR) IMPLANT
SET TROCAR LAP APPLE-HUNT 5MM (ENDOMECHANICALS) ×2 IMPLANT
SLEEVE SCD COMPRESS KNEE MED (MISCELLANEOUS) ×1 IMPLANT
SUT MNCRL AB 4-0 PS2 18 (SUTURE) ×2 IMPLANT
TOWEL OR 17X24 6PK STRL BLUE (TOWEL DISPOSABLE) ×2 IMPLANT
TRAY FOLEY CATH 14FR (SET/KITS/TRAYS/PACK) ×2 IMPLANT
TRAY LAPAROSCOPIC (CUSTOM PROCEDURE TRAY) ×2 IMPLANT

## 2012-03-05 NOTE — Op Note (Signed)
LAPAROSCOPIC INGUINAL HERNIA  Procedure Note  JAMES SENN 03/05/2012   Pre-op Diagnosis: Right inguinal hernia     Post-op Diagnosis: same  Procedure(s): LAPAROSCOPIC RIGHT INGUINAL HERNIA REPAIR WITH MESH (LARGE BARD 3D MAX)  Surgeon(s): Shelly Rubenstein, MD  Anesthesia: General  Staff:  Randalyn Rhea, RN - Circulator Raliegh Scarlet, RN - Scrub Person Salley Scarlet, RN - Circulator Assistant  Estimated Blood Loss: Minimal               Findings: he was that of a very large indirect right inguinal hernia which was repaired with a piece of Prolene 3-D Max mesh from [employer]  Procedure: The patient was brought to the operating room and identified as the correct patient. He was placed supine on the operating room table and general anesthesia was induced. His abdomen was then prepped and draped in usual sterile fashion after Foley catheter had been inserted. A small transverse incision just below the umbilicus with scalpel. I took this down to the fascia which was opened just to the right of midline. The rectus muscle was then identified and elevated. The dissecting balloon was passed underneath the rectus muscle and manipulated to the pubis. The dissecting balloon was then insufflated under direct vision dissecting out the preperitoneal space. The only dissect out the right side and would not open up the left. The dissecting balloon was then removed and insufflation was begun with carbon dioxide. 25 mm ports were then placed the patient's lower midline under direct vision. The right inguinal area was then dissected out. The patient had a very large indirect hernia sac which was very difficult to separate from the cord structures back to probably reduce it. I was able to easily identify Cooper's ligament. Once the sac was reduced, I placed a piece of large Prolene 3-D Max mesh through the umbilical port and opened it up in an onlay fashion on the right inguinal floor. I then used the  secure strap surgical tacker with absorbable tacks to tack the mesh and circumferentially going up Cooper's ligament, medial abdominal wall, and out slightly laterally. Good coverage of the inguinal floor appeared to be achieved. All ports were then removed and the preperitoneal space was deflated leaving the mesh in place. I closed the fascia at the umbilicus with a figure-of-eight 0 Vicryl suture. All incisions were then anesthetized with quarter percent Marcaine. Steri-Strips and bandages were applied. The patient tolerated the procedure well. The Foley catheter was removed. All counts were correct at the end of the procedure. The patient was then extubated in the operating room and taken in stable condition to the recovery room.          Sharrell Krawiec A   Date: 03/05/2012  Time: 8:33 AM

## 2012-03-05 NOTE — Progress Notes (Signed)
Assisted Dr. Frederick with right, ultrasound guided, transabdominal plane block. Side rails up, monitors on throughout procedure. See vital signs in flow sheet. Tolerated Procedure well. 

## 2012-03-05 NOTE — Anesthesia Preprocedure Evaluation (Signed)
Anesthesia Evaluation  Patient identified by MRN, date of birth, ID band Patient awake    Reviewed: Allergy & Precautions, H&P , NPO status , Patient's Chart, lab work & pertinent test results, reviewed documented beta blocker date and time   Airway Mallampati: II TM Distance: >3 FB Neck ROM: full    Dental   Pulmonary sleep apnea ,  breath sounds clear to auscultation        Cardiovascular hypertension, On Medications Rhythm:regular     Neuro/Psych negative neurological ROS  negative psych ROS   GI/Hepatic Neg liver ROS, GERD-  Medicated and Controlled,  Endo/Other  negative endocrine ROS  Renal/GU negative Renal ROS  negative genitourinary   Musculoskeletal   Abdominal   Peds  Hematology negative hematology ROS (+)   Anesthesia Other Findings See surgeon's H&P   Reproductive/Obstetrics negative OB ROS                           Anesthesia Physical Anesthesia Plan  ASA: II  Anesthesia Plan: General   Post-op Pain Management:    Induction: Intravenous  Airway Management Planned: LMA  Additional Equipment:   Intra-op Plan:   Post-operative Plan: Extubation in OR  Informed Consent: I have reviewed the patients History and Physical, chart, labs and discussed the procedure including the risks, benefits and alternatives for the proposed anesthesia with the patient or authorized representative who has indicated his/her understanding and acceptance.   Dental Advisory Given  Plan Discussed with: CRNA and Surgeon  Anesthesia Plan Comments:         Anesthesia Quick Evaluation

## 2012-03-05 NOTE — Anesthesia Postprocedure Evaluation (Signed)
Anesthesia Post Note  Patient: Fred [name]  Procedure(s) Performed: Procedure(s) (LRB): LAPAROSCOPIC INGUINAL HERNIA (Right)  Anesthesia type: General  Patient location: PACU  Post pain: Pain level controlled  Post assessment: Patient's Cardiovascular Status Stable  Last Vitals:  Filed Vitals:   03/05/12 0945  BP: 109/71  Pulse: 60  Temp:   Resp: 14    Post vital signs: Reviewed and stable  Level of consciousness: alert  Complications: No apparent anesthesia complications

## 2012-03-05 NOTE — Interval H&P Note (Signed)
History and Physical Interval Note:  No change in history or exam  03/05/2012 6:59 AM  Fred [name]  has presented today for surgery, with the diagnosis of Right inguinal hernia  The various methods of treatment have been discussed with the patient and family. After consideration of risks, benefits and other options for treatment, the patient has consented to  Procedure(s) (LRB): LAPAROSCOPIC INGUINAL HERNIA (Right) as a surgical intervention .  The patient's history has been reviewed, patient examined, no change in status, stable for surgery.  I have reviewed the patient's chart and labs.  Questions were answered to the patient's satisfaction.     Gracie Gupta A

## 2012-03-05 NOTE — Anesthesia Procedure Notes (Addendum)
Anesthesia Regional Block:  TAP block  Pre-Anesthetic Checklist: ,, timeout performed, Correct Patient, Correct Site, Correct Laterality, Correct Procedure, Correct Position, site marked, Risks and benefits discussed,  Surgical consent,  Pre-op evaluation,  At surgeon's request and post-op pain management  Laterality: Right  Prep: chloraprep       Needles:  Injection technique: Single-shot  Needle Type: Echogenic Needle     Needle Length: 9cm  Needle Gauge: 21    Additional Needles:  Procedures: ultrasound guided TAP block Narrative:  Start time: 03/05/2012 6:54 AM End time: 03/05/2012 7:02 AM Injection made incrementally with aspirations every 5 mL.  Performed by: Personally  Anesthesiologist: Aldona Lento, MD  Additional Notes: Ultrasound guidance used to: id relevant anatomy, confirm needle position, local anesthetic spread, avoidance of vascular puncture. Picture saved. No complications. Block performed personally by Janetta Hora. Gelene Mink, MD    TAP block Procedure Name: Intubation Date/Time: 03/05/2012 7:38 AM Performed by: Burna Cash Pre-anesthesia Checklist: Patient identified, Emergency Drugs available, Suction available and Patient being monitored Patient Re-evaluated:Patient Re-evaluated prior to inductionOxygen Delivery Method: Circle System Utilized Preoxygenation: Pre-oxygenation with 100% oxygen Intubation Type: IV induction Ventilation: Mask ventilation without difficulty Laryngoscope Size: Mac and 3 Grade View: Grade IV Tube type: Oral Tube size: 8.0 mm Number of attempts: 3 Airway Equipment and Method: stylet,  oral airway,  LTA kit utilized,  Stylet and Video-laryngoscopy Placement Confirmation: ETT inserted through vocal cords under direct vision,  positive ETCO2 and breath sounds checked- equal and bilateral Secured at: 22 cm Tube secured with: Tape Dental Injury: Teeth and Oropharynx as per pre-operative assessment  Comments: Laryngoscopy x 2  with Mac 3, Miller 2, with poor visualization secondary to small mouth, large amt soft tissue, and anterior epiglottis. Intubated with #4 glidescope by Dr. Gelene Mink with effort.

## 2012-03-05 NOTE — Transfer of Care (Signed)
Immediate Anesthesia Transfer of Care Note  Patient: Fred [name]  Procedure(s) Performed: Procedure(s) (LRB): LAPAROSCOPIC INGUINAL HERNIA (Right)  Patient Location: PACU  Anesthesia Type: General  Level of Consciousness: sedated  Airway & Oxygen Therapy: Patient Spontanous Breathing and Patient connected to face mask oxygen  Post-op Assessment: Report given to PACU RN and Post -op Vital signs reviewed and stable  Post vital signs: Reviewed and stable  Complications: No apparent anesthesia complications

## 2012-03-13 ENCOUNTER — Encounter (HOSPITAL_BASED_OUTPATIENT_CLINIC_OR_DEPARTMENT_OTHER): Payer: Self-pay | Admitting: Surgery

## 2012-03-24 ENCOUNTER — Encounter (INDEPENDENT_AMBULATORY_CARE_PROVIDER_SITE_OTHER): Payer: Self-pay | Admitting: Surgery

## 2012-03-24 ENCOUNTER — Ambulatory Visit (INDEPENDENT_AMBULATORY_CARE_PROVIDER_SITE_OTHER): Payer: PRIVATE HEALTH INSURANCE | Admitting: Surgery

## 2012-03-24 VITALS — BP 124/80 | HR 78 | Temp 97.7°F | Resp 18 | Ht 69.0 in | Wt 220.2 lb

## 2012-03-24 DIAGNOSIS — Z09 Encounter for follow-up examination after completed treatment for conditions other than malignant neoplasm: Secondary | ICD-10-CM

## 2012-03-24 NOTE — Progress Notes (Signed)
Subjective:     Patient ID: Fred [name], male   DOB: 07-Dec-1950, 61 y.o.   MRN: [id number]  HPI He is here for his first postop visit status post laparoscopic right inguinal hernia repair with mesh He is doing very well and has No complaints Review of Systems     Objective:   Physical Exam On exam, his incisions are well healed and there is no evidence of recurrent hernia    Assessment:     Patient stable postop    Plan:     He will return to work and normal activity on September 4. I will see him back as needed

## 2012-03-24 NOTE — Patient Instructions (Signed)
Ok to return to lifting on sept 4th

## 2012-04-16 ENCOUNTER — Ambulatory Visit (INDEPENDENT_AMBULATORY_CARE_PROVIDER_SITE_OTHER): Payer: PRIVATE HEALTH INSURANCE | Admitting: Otolaryngology

## 2012-04-16 DIAGNOSIS — H903 Sensorineural hearing loss, bilateral: Secondary | ICD-10-CM

## 2012-04-16 DIAGNOSIS — H6520 Chronic serous otitis media, unspecified ear: Secondary | ICD-10-CM

## 2012-04-16 DIAGNOSIS — H902 Conductive hearing loss, unspecified: Secondary | ICD-10-CM

## 2012-04-16 DIAGNOSIS — H6980 Other specified disorders of Eustachian tube, unspecified ear: Secondary | ICD-10-CM

## 2016-08-01 ENCOUNTER — Encounter (INDEPENDENT_AMBULATORY_CARE_PROVIDER_SITE_OTHER): Payer: PRIVATE HEALTH INSURANCE | Admitting: Ophthalmology

## 2016-08-01 DIAGNOSIS — H4422 Degenerative myopia, left eye: Secondary | ICD-10-CM

## 2016-08-01 DIAGNOSIS — I10 Essential (primary) hypertension: Secondary | ICD-10-CM

## 2016-08-01 DIAGNOSIS — H2513 Age-related nuclear cataract, bilateral: Secondary | ICD-10-CM

## 2016-08-01 DIAGNOSIS — H33301 Unspecified retinal break, right eye: Secondary | ICD-10-CM | POA: Diagnosis not present

## 2016-08-01 DIAGNOSIS — H35033 Hypertensive retinopathy, bilateral: Secondary | ICD-10-CM | POA: Diagnosis not present

## 2016-08-01 DIAGNOSIS — H43813 Vitreous degeneration, bilateral: Secondary | ICD-10-CM | POA: Diagnosis not present

## 2016-08-16 ENCOUNTER — Ambulatory Visit (INDEPENDENT_AMBULATORY_CARE_PROVIDER_SITE_OTHER): Payer: PRIVATE HEALTH INSURANCE | Admitting: Ophthalmology

## 2016-08-16 DIAGNOSIS — H33301 Unspecified retinal break, right eye: Secondary | ICD-10-CM

## 2016-08-31 ENCOUNTER — Encounter (INDEPENDENT_AMBULATORY_CARE_PROVIDER_SITE_OTHER): Payer: PRIVATE HEALTH INSURANCE | Admitting: Ophthalmology

## 2016-08-31 DIAGNOSIS — H1011 Acute atopic conjunctivitis, right eye: Secondary | ICD-10-CM | POA: Diagnosis not present

## 2016-12-16 ENCOUNTER — Ambulatory Visit (INDEPENDENT_AMBULATORY_CARE_PROVIDER_SITE_OTHER): Payer: PRIVATE HEALTH INSURANCE | Admitting: Ophthalmology

## 2017-04-22 ENCOUNTER — Encounter: Payer: Self-pay | Admitting: Gastroenterology

## 2017-06-12 ENCOUNTER — Ambulatory Visit (AMBULATORY_SURGERY_CENTER): Payer: Self-pay | Admitting: *Deleted

## 2017-06-12 ENCOUNTER — Other Ambulatory Visit: Payer: Self-pay

## 2017-06-12 VITALS — Ht 68.0 in | Wt 221.0 lb

## 2017-06-12 DIAGNOSIS — Z800 Family history of malignant neoplasm of digestive organs: Secondary | ICD-10-CM

## 2017-06-12 MED ORDER — NA SULFATE-K SULFATE-MG SULF 17.5-3.13-1.6 GM/177ML PO SOLN: 1.0000 | Freq: Once | ORAL | 0 refills | Status: AC

## 2017-06-12 NOTE — Progress Notes (Signed)
No egg or soy allergy known to patient  No issues with past sedation with any surgeries  or procedures, no intubation problems  No diet pills per patient No home 02 use per patient  No blood thinners per patient  Pt denies issues with constipation  No A fib or A flutter  EMMI video sent to pt's e mail - pt declined  $15 dollar coupon given to pt today in [place]

## 2017-06-26 ENCOUNTER — Encounter: Payer: PRIVATE HEALTH INSURANCE | Admitting: Internal Medicine

## 2017-06-27 ENCOUNTER — Other Ambulatory Visit: Payer: Self-pay

## 2017-06-27 ENCOUNTER — Ambulatory Visit (AMBULATORY_SURGERY_CENTER): Payer: PRIVATE HEALTH INSURANCE | Admitting: Gastroenterology

## 2017-06-27 ENCOUNTER — Encounter: Payer: Self-pay | Admitting: Gastroenterology

## 2017-06-27 VITALS — BP 148/82 | HR 60 | Temp 97.7°F | Resp 13 | Ht 68.0 in | Wt 221.0 lb

## 2017-06-27 DIAGNOSIS — D122 Benign neoplasm of ascending colon: Secondary | ICD-10-CM

## 2017-06-27 DIAGNOSIS — Z800 Family history of malignant neoplasm of digestive organs: Secondary | ICD-10-CM

## 2017-06-27 DIAGNOSIS — Z1211 Encounter for screening for malignant neoplasm of colon: Secondary | ICD-10-CM | POA: Diagnosis not present

## 2017-06-27 MED ORDER — SODIUM CHLORIDE 0.9 % IV SOLN: 500.0000 mL | INTRAVENOUS | Status: DC

## 2017-06-27 NOTE — Op Note (Addendum)
[facility] Patient Name: Fred [name] Procedure Date: 06/27/2017 10:18 AM MRN: [id number] Endoscopist: Mauri Pole , MD Age: 66 Referring MD:  Date of Birth: 10-20-1950 Gender: Male Account #: [id number] Procedure:                Colonoscopy Indications:              Screening patient at increased risk: Family history                            of 1st-degree relative with colorectal cancer at                            age 22 years (or older) Medicines:                Monitored Anesthesia Care Procedure:                Pre-Anesthesia Assessment:                           - Prior to the procedure, a History and Physical                            was performed, and patient medications and                            allergies were reviewed. The patient's tolerance of                            previous anesthesia was also reviewed. The risks                            and benefits of the procedure and the sedation                            options and risks were discussed with the patient.                            All questions were answered, and informed consent                            was obtained. Prior Anticoagulants: The patient has                            taken no previous anticoagulant or antiplatelet                            agents. ASA Grade Assessment: II - A patient with                            mild systemic disease. After reviewing the risks                            and benefits, the patient was deemed in  satisfactory condition to undergo the procedure.                           After obtaining informed consent, the colonoscope                            was passed under direct vision. Throughout the                            procedure, the patient's blood pressure, pulse, and                            oxygen saturations were monitored continuously. The                            Colonoscope was introduced  through the anus and                            advanced to the the cecum, identified by                            appendiceal orifice and ileocecal valve. The                            colonoscopy was performed without difficulty. The                            patient tolerated the procedure well. The quality                            of the bowel preparation was fair, adequate after                            extensive lavage and suction. The ileocecal valve,                            appendiceal orifice, and rectum were photographed. Scope In: 10:34:04 AM Scope Out: 11:06:10 AM Scope Withdrawal Time: 0 hours 27 minutes 1 second  Total Procedure Duration: 0 hours 32 minutes 6 seconds  Findings:                 The perianal and digital rectal examinations were                            normal.                           A 12 mm polyp was found in the ascending colon. The                            polyp was semi-pedunculated. The polyp was removed                            with a hot snare. Resection and retrieval were  complete.                           A 7 mm polyp was found in the ascending colon. The                            polyp was sessile. The polyp was removed with a                            cold snare. Resection and retrieval were complete.                           Multiple small and large-mouthed diverticula were                            found in the sigmoid colon and descending colon.                            There was narrowing of the colon in association                            with the diverticular opening. There was evidence                            of diverticular spasm. There was evidence of an                            impacted diverticulum. There was no evidence of                            diverticular bleeding.                           Non-bleeding internal hemorrhoids were found during                             retroflexion. The hemorrhoids were small. Complications:            No immediate complications. Estimated Blood Loss:     Estimated blood loss was minimal. Impression:               - Preparation of the colon was fair.                           - One 12 mm polyp in the ascending colon, removed                            with a hot snare. Resected and retrieved.                           - One 7 mm polyp in the ascending colon, removed                            with a cold snare. Resected and retrieved.                           -  Moderate diverticulosis in the sigmoid colon and                            in the descending colon. There was narrowing of the                            colon in association with the diverticular opening.                            There was evidence of diverticular spasm. There was                            evidence of an impacted diverticulum. There was no                            evidence of diverticular bleeding.                           - Non-bleeding internal hemorrhoids. Recommendation:           - Patient has a contact number available for                            emergencies. The signs and symptoms of potential                            delayed complications were discussed with the                            patient. Return to normal activities tomorrow.                            Written discharge instructions were provided to the                            patient.                           - Resume previous diet.                           - Continue present medications.                           - Await pathology results.                           - Repeat colonoscopy in 3 - 5 years for                            surveillance based on pathology results. Mauri Pole, MD 06/27/2017 11:11:46 AM This report has been signed electronically.

## 2017-06-27 NOTE — Patient Instructions (Signed)
YOU HAD AN ENDOSCOPIC PROCEDURE TODAY AT THE [facility]:   Refer to the procedure report that was given to you for any specific questions about what was found during the examination.  If the procedure report does not answer your questions, please call your gastroenterologist to clarify.  If you requested that your care partner not be given the details of your procedure findings, then the procedure report has been included in a sealed envelope for you to review at your convenience later.  YOU SHOULD EXPECT: Some feelings of bloating in the abdomen. Passage of more gas than usual.  Walking can help get rid of the air that was put into your GI tract during the procedure and reduce the bloating. If you had a lower endoscopy (such as a colonoscopy or flexible sigmoidoscopy) you may notice spotting of blood in your stool or on the toilet paper. If you underwent a bowel prep for your procedure, you may not have a normal bowel movement for a few days.  Please Note:  You might notice some irritation and congestion in your nose or some drainage.  This is from the oxygen used during your procedure.  There is no need for concern and it should clear up in a day or so.  SYMPTOMS TO REPORT IMMEDIATELY:   Following lower endoscopy (colonoscopy or flexible sigmoidoscopy):  Excessive amounts of blood in the stool  Significant tenderness or worsening of abdominal pains  Swelling of the abdomen that is new, acute  Fever of 100F or higher  For urgent or emergent issues, a gastroenterologist can be reached at any hour by calling [phone].   DIET:  We do recommend a small meal at first, but then you may proceed to your regular diet.  Drink plenty of fluids but you should avoid alcoholic beverages for 24 hours.  MEDICATIONS: Continue present medications.  Please see handouts given to you by your recovery nurse.  ACTIVITY:  You should plan to take it easy for the rest of today and you should NOT  DRIVE or use heavy machinery until tomorrow (because of the sedation medicines used during the test).    FOLLOW UP: Our staff will call the number listed on your records the next business day following your procedure to check on you and address any questions or concerns that you may have regarding the information given to you following your procedure. If we do not reach you, we will leave a message.  However, if you are feeling well and you are not experiencing any problems, there is no need to return our call.  We will assume that you have returned to your regular daily activities without incident.  If any biopsies were taken you will be contacted by phone or by letter within the next 1-3 weeks.  Please call us at [phone] if you have not heard about the biopsies in 3 weeks.   Thank you for allowing us to provide for your healthcare needs today.   SIGNATURES/CONFIDENTIALITY: You and/or your care partner have signed paperwork which will be entered into your electronic medical record.  These signatures attest to the fact that that the information above on your After Visit Summary has been reviewed and is understood.  Full responsibility of the confidentiality of this discharge information lies with you and/or your care-partner. 

## 2017-06-27 NOTE — Progress Notes (Signed)
Called to room to assist during endoscopic procedure.  Patient ID and intended procedure confirmed with present staff. Received instructions for my participation in the procedure from the performing physician.  

## 2017-06-27 NOTE — Progress Notes (Signed)
Pt's states no medical or surgical changes since previsit or office visit. 

## 2017-06-27 NOTE — Progress Notes (Signed)
A and O x3. Report to RN. Tolerated MAC anesthesia well.

## 2017-06-30 ENCOUNTER — Telehealth: Payer: Self-pay

## 2017-06-30 ENCOUNTER — Telehealth: Payer: Self-pay | Admitting: *Deleted

## 2017-06-30 NOTE — Telephone Encounter (Signed)
  Follow up Call-  Call back number 06/27/2017  Post procedure Call Back phone  # [phone]  Permission to leave phone message Yes  Some recent data might be hidden     Patient questions:  Do you have a fever, pain , or abdominal swelling? No. Pain Score  0 *  Have you tolerated food without any problems? Yes.    Have you been able to return to your normal activities? Yes.    Do you have any questions about your discharge instructions: Diet   No. Medications  No. Follow up visit  No.  Do you have questions or concerns about your Care? No.  Actions: * If pain score is 4 or above: No action needed, pain <4.

## 2017-06-30 NOTE — Telephone Encounter (Signed)
NO answer for post procedure call back will attempt to call back later this afternoon. SM

## 2017-07-02 ENCOUNTER — Encounter: Payer: Self-pay | Admitting: Gastroenterology

## 2018-09-02 DIAGNOSIS — G4733 Obstructive sleep apnea (adult) (pediatric): Secondary | ICD-10-CM | POA: Diagnosis not present

## 2018-10-01 DIAGNOSIS — G4733 Obstructive sleep apnea (adult) (pediatric): Secondary | ICD-10-CM | POA: Diagnosis not present

## 2018-11-01 DIAGNOSIS — G4733 Obstructive sleep apnea (adult) (pediatric): Secondary | ICD-10-CM | POA: Diagnosis not present

## 2018-11-04 ENCOUNTER — Encounter: Payer: Self-pay | Admitting: *Deleted

## 2018-12-01 DIAGNOSIS — G4733 Obstructive sleep apnea (adult) (pediatric): Secondary | ICD-10-CM | POA: Diagnosis not present

## 2018-12-24 ENCOUNTER — Other Ambulatory Visit: Payer: Self-pay | Admitting: *Deleted

## 2018-12-24 NOTE — Patient Outreach (Signed)
[facility]) Care Management  12/24/2018  Fred [name] Sep 14, 1950 [id number]   HTA HRA Telephone Screen     Outreach Attempt:  Successful telephone outreach to patient in the month of April to complete Health Risk Assessment Screening.  HIPAA verified with patient.  Patient reporting history of hypertension that is well controlled.  Does not monitor his blood pressures at home.  Denies having an Advance Directive and does not want information to create one.  Does have a question concerning Prilosec being covered by [employer].  Encouraged patient to contact [employer] to verify if medication is covered if patient has a prescription from provider. Instructed patient if he continued to have questions or difficulties to contact [place] [facility] back for possible [place] referral.  Patient stated his understanding.  Denies any further needs, at this time.   Plan:  Patient will be placed in a [place] with 3 month follow up to check on medication question/concern.  RN Health Coach will send [name].  RN Health Coach will make next telephone outreach to patient for follow up in the month of July.  [place] [phone] [name]

## 2019-01-01 DIAGNOSIS — G4733 Obstructive sleep apnea (adult) (pediatric): Secondary | ICD-10-CM | POA: Diagnosis not present

## 2019-01-22 ENCOUNTER — Other Ambulatory Visit: Payer: Self-pay | Admitting: *Deleted

## 2019-01-22 NOTE — Patient Outreach (Signed)
[facility]) Care Management  01/22/2019  Fred [name] 02/13/1951 [id number]   Case Closure/Transition to [facility]   Outreach:  Patient case has been transitioned to [facility] for further Care Management assistance.  Plan: RN Health Coach will close case at this time. RN Health Coach will send primary care provider Care Management Case Closure Letter. RN Health Coach will make patient inactive with [facility] at this time.  [place] [phone] [email or web address]

## 2019-01-31 DIAGNOSIS — G4733 Obstructive sleep apnea (adult) (pediatric): Secondary | ICD-10-CM | POA: Diagnosis not present

## 2019-02-05 DIAGNOSIS — Z23 Encounter for immunization: Secondary | ICD-10-CM | POA: Diagnosis not present

## 2019-02-05 DIAGNOSIS — I10 Essential (primary) hypertension: Secondary | ICD-10-CM | POA: Diagnosis not present

## 2019-02-05 DIAGNOSIS — E6609 Other obesity due to excess calories: Secondary | ICD-10-CM | POA: Diagnosis not present

## 2019-02-05 DIAGNOSIS — R7309 Other abnormal glucose: Secondary | ICD-10-CM | POA: Diagnosis not present

## 2019-02-05 DIAGNOSIS — Z0001 Encounter for general adult medical examination with abnormal findings: Secondary | ICD-10-CM | POA: Diagnosis not present

## 2019-02-05 DIAGNOSIS — Z1389 Encounter for screening for other disorder: Secondary | ICD-10-CM | POA: Diagnosis not present

## 2019-02-05 DIAGNOSIS — E7849 Other hyperlipidemia: Secondary | ICD-10-CM | POA: Diagnosis not present

## 2019-02-05 DIAGNOSIS — Z6833 Body mass index (BMI) 33.0-33.9, adult: Secondary | ICD-10-CM | POA: Diagnosis not present

## 2019-02-15 ENCOUNTER — Ambulatory Visit: Payer: PRIVATE HEALTH INSURANCE | Admitting: *Deleted

## 2019-03-01 ENCOUNTER — Other Ambulatory Visit: Payer: Self-pay

## 2019-03-03 DIAGNOSIS — G4733 Obstructive sleep apnea (adult) (pediatric): Secondary | ICD-10-CM | POA: Diagnosis not present

## 2019-04-03 DIAGNOSIS — G4733 Obstructive sleep apnea (adult) (pediatric): Secondary | ICD-10-CM | POA: Diagnosis not present

## 2019-05-03 DIAGNOSIS — G4733 Obstructive sleep apnea (adult) (pediatric): Secondary | ICD-10-CM | POA: Diagnosis not present

## 2019-06-03 DIAGNOSIS — G4733 Obstructive sleep apnea (adult) (pediatric): Secondary | ICD-10-CM | POA: Diagnosis not present

## 2019-07-03 DIAGNOSIS — G4733 Obstructive sleep apnea (adult) (pediatric): Secondary | ICD-10-CM | POA: Diagnosis not present

## 2019-08-03 DIAGNOSIS — G4733 Obstructive sleep apnea (adult) (pediatric): Secondary | ICD-10-CM | POA: Diagnosis not present

## 2019-09-03 DIAGNOSIS — G4733 Obstructive sleep apnea (adult) (pediatric): Secondary | ICD-10-CM | POA: Diagnosis not present

## 2019-09-23 DIAGNOSIS — G4733 Obstructive sleep apnea (adult) (pediatric): Secondary | ICD-10-CM | POA: Diagnosis not present

## 2019-10-27 DIAGNOSIS — E7849 Other hyperlipidemia: Secondary | ICD-10-CM | POA: Diagnosis not present

## 2019-10-27 DIAGNOSIS — G4730 Sleep apnea, unspecified: Secondary | ICD-10-CM | POA: Diagnosis not present

## 2019-10-27 DIAGNOSIS — I10 Essential (primary) hypertension: Secondary | ICD-10-CM | POA: Diagnosis not present

## 2019-10-27 DIAGNOSIS — R7301 Impaired fasting glucose: Secondary | ICD-10-CM | POA: Diagnosis not present

## 2020-01-26 DIAGNOSIS — R7301 Impaired fasting glucose: Secondary | ICD-10-CM | POA: Diagnosis not present

## 2020-01-26 DIAGNOSIS — I10 Essential (primary) hypertension: Secondary | ICD-10-CM | POA: Diagnosis not present

## 2020-01-26 DIAGNOSIS — G4730 Sleep apnea, unspecified: Secondary | ICD-10-CM | POA: Diagnosis not present

## 2020-01-26 DIAGNOSIS — E7849 Other hyperlipidemia: Secondary | ICD-10-CM | POA: Diagnosis not present

## 2020-02-17 DIAGNOSIS — E6609 Other obesity due to excess calories: Secondary | ICD-10-CM | POA: Diagnosis not present

## 2020-02-17 DIAGNOSIS — Z0001 Encounter for general adult medical examination with abnormal findings: Secondary | ICD-10-CM | POA: Diagnosis not present

## 2020-02-17 DIAGNOSIS — Z1389 Encounter for screening for other disorder: Secondary | ICD-10-CM | POA: Diagnosis not present

## 2020-02-17 DIAGNOSIS — I10 Essential (primary) hypertension: Secondary | ICD-10-CM | POA: Diagnosis not present

## 2020-02-17 DIAGNOSIS — R7309 Other abnormal glucose: Secondary | ICD-10-CM | POA: Diagnosis not present

## 2020-02-17 DIAGNOSIS — E7849 Other hyperlipidemia: Secondary | ICD-10-CM | POA: Diagnosis not present

## 2020-02-17 DIAGNOSIS — Z6833 Body mass index (BMI) 33.0-33.9, adult: Secondary | ICD-10-CM | POA: Diagnosis not present

## 2020-02-17 DIAGNOSIS — G4730 Sleep apnea, unspecified: Secondary | ICD-10-CM | POA: Diagnosis not present

## 2020-03-15 DIAGNOSIS — E876 Hypokalemia: Secondary | ICD-10-CM | POA: Diagnosis not present

## 2020-03-28 DIAGNOSIS — G4730 Sleep apnea, unspecified: Secondary | ICD-10-CM | POA: Diagnosis not present

## 2020-03-28 DIAGNOSIS — E7849 Other hyperlipidemia: Secondary | ICD-10-CM | POA: Diagnosis not present

## 2020-03-28 DIAGNOSIS — I10 Essential (primary) hypertension: Secondary | ICD-10-CM | POA: Diagnosis not present

## 2020-05-27 DIAGNOSIS — R7301 Impaired fasting glucose: Secondary | ICD-10-CM | POA: Diagnosis not present

## 2020-05-27 DIAGNOSIS — I10 Essential (primary) hypertension: Secondary | ICD-10-CM | POA: Diagnosis not present

## 2020-05-27 DIAGNOSIS — E7849 Other hyperlipidemia: Secondary | ICD-10-CM | POA: Diagnosis not present

## 2020-05-27 DIAGNOSIS — G4730 Sleep apnea, unspecified: Secondary | ICD-10-CM | POA: Diagnosis not present

## 2020-06-14 DIAGNOSIS — G4733 Obstructive sleep apnea (adult) (pediatric): Secondary | ICD-10-CM | POA: Diagnosis not present

## 2020-07-04 DIAGNOSIS — G4733 Obstructive sleep apnea (adult) (pediatric): Secondary | ICD-10-CM | POA: Diagnosis not present

## 2020-07-28 DIAGNOSIS — R7301 Impaired fasting glucose: Secondary | ICD-10-CM | POA: Diagnosis not present

## 2020-07-28 DIAGNOSIS — I10 Essential (primary) hypertension: Secondary | ICD-10-CM | POA: Diagnosis not present

## 2020-07-28 DIAGNOSIS — G4730 Sleep apnea, unspecified: Secondary | ICD-10-CM | POA: Diagnosis not present

## 2020-07-28 DIAGNOSIS — E7849 Other hyperlipidemia: Secondary | ICD-10-CM | POA: Diagnosis not present

## 2020-09-12 ENCOUNTER — Encounter: Payer: Self-pay | Admitting: Gastroenterology

## 2020-09-25 DIAGNOSIS — E7849 Other hyperlipidemia: Secondary | ICD-10-CM | POA: Diagnosis not present

## 2020-09-25 DIAGNOSIS — R7301 Impaired fasting glucose: Secondary | ICD-10-CM | POA: Diagnosis not present

## 2020-09-25 DIAGNOSIS — I10 Essential (primary) hypertension: Secondary | ICD-10-CM | POA: Diagnosis not present

## 2020-09-25 DIAGNOSIS — G4730 Sleep apnea, unspecified: Secondary | ICD-10-CM | POA: Diagnosis not present

## 2020-11-13 ENCOUNTER — Encounter: Payer: Self-pay | Admitting: Gastroenterology

## 2020-11-25 DIAGNOSIS — I10 Essential (primary) hypertension: Secondary | ICD-10-CM | POA: Diagnosis not present

## 2020-11-25 DIAGNOSIS — E7849 Other hyperlipidemia: Secondary | ICD-10-CM | POA: Diagnosis not present

## 2021-02-08 ENCOUNTER — Ambulatory Visit (AMBULATORY_SURGERY_CENTER): Payer: Self-pay | Admitting: *Deleted

## 2021-02-08 ENCOUNTER — Other Ambulatory Visit: Payer: Self-pay

## 2021-02-08 VITALS — Ht 69.0 in | Wt 225.2 lb

## 2021-02-08 DIAGNOSIS — Z800 Family history of malignant neoplasm of digestive organs: Secondary | ICD-10-CM

## 2021-02-08 DIAGNOSIS — Z86010 Personal history of colonic polyps: Secondary | ICD-10-CM

## 2021-02-08 MED ORDER — SUPREP BOWEL PREP KIT 17.5-3.13-1.6 GM/177ML PO SOLN
1.0000 | Freq: Once | ORAL | 0 refills | Status: AC
Start: 2021-02-08 — End: 2021-02-08

## 2021-02-08 NOTE — Progress Notes (Signed)
  No trouble with anesthesia, denies being told they were difficult to intubate, or hx/fam hx of malignant hyperthermia per pt   No egg or soy allergy  No home oxygen use   No medications for weight loss taken  Pt denies constipation issues  Pt informed that we do not do prior authorizations for prep  

## 2021-02-22 ENCOUNTER — Other Ambulatory Visit: Payer: Self-pay

## 2021-02-22 ENCOUNTER — Ambulatory Visit (AMBULATORY_SURGERY_CENTER): Payer: PPO | Admitting: Gastroenterology

## 2021-02-22 ENCOUNTER — Encounter: Payer: Self-pay | Admitting: Gastroenterology

## 2021-02-22 VITALS — BP 114/76 | HR 56 | Temp 98.0°F | Resp 14 | Ht 69.0 in | Wt 225.2 lb

## 2021-02-22 DIAGNOSIS — Z86010 Personal history of colonic polyps: Secondary | ICD-10-CM | POA: Diagnosis not present

## 2021-02-22 DIAGNOSIS — D129 Benign neoplasm of anus and anal canal: Secondary | ICD-10-CM

## 2021-02-22 DIAGNOSIS — K621 Rectal polyp: Secondary | ICD-10-CM | POA: Diagnosis not present

## 2021-02-22 DIAGNOSIS — Z800 Family history of malignant neoplasm of digestive organs: Secondary | ICD-10-CM | POA: Diagnosis not present

## 2021-02-22 DIAGNOSIS — K219 Gastro-esophageal reflux disease without esophagitis: Secondary | ICD-10-CM | POA: Diagnosis not present

## 2021-02-22 DIAGNOSIS — D128 Benign neoplasm of rectum: Secondary | ICD-10-CM

## 2021-02-22 DIAGNOSIS — I10 Essential (primary) hypertension: Secondary | ICD-10-CM | POA: Diagnosis not present

## 2021-02-22 DIAGNOSIS — G4733 Obstructive sleep apnea (adult) (pediatric): Secondary | ICD-10-CM | POA: Diagnosis not present

## 2021-02-22 MED ORDER — SODIUM CHLORIDE 0.9 % IV SOLN: 500.0000 mL | Freq: Once | INTRAVENOUS | Status: DC

## 2021-02-22 NOTE — Op Note (Signed)
[name] Procedure Date: 02/22/2021 8:08 AM MRN: [id number] Endoscopist: Mauri Pole , MD Age: 70 Referring MD:  Date of Birth: 1950/11/22 Gender: Male Account #: [id number] Procedure:                Colonoscopy Indications:              Screening in patient at increased risk: Family                            history of 1st-degree relative with colorectal                            cancer, High risk colon cancer surveillance:                            Personal history of colonic polyps, High risk colon                            cancer surveillance: Personal history of adenoma                            (10 mm or greater in size), High risk colon cancer                            surveillance: Personal history of multiple (3 or                            more) adenomas Medicines:                Monitored Anesthesia Care Procedure:                Pre-Anesthesia Assessment:                           - Prior to the procedure, a History and Physical                            was performed, and patient medications and                            allergies were reviewed. The patient's tolerance of                            previous anesthesia was also reviewed. The risks                            and benefits of the procedure and the sedation                            options and risks were discussed with the patient.                            All questions were answered, and informed consent  was obtained. Prior Anticoagulants: The patient has                            taken no previous anticoagulant or antiplatelet                            agents. ASA Grade Assessment: II - A patient with                            mild systemic disease. After reviewing the risks                            and benefits, the patient was deemed in                            satisfactory condition to undergo the  procedure.                           After obtaining informed consent, the colonoscope                            was passed under direct vision. Throughout the                            procedure, the patient's blood pressure, pulse, and                            oxygen saturations were monitored continuously. The                            Olympus PCF-H190DL [id number] [id number]) Colonoscope was                            introduced through the anus and advanced to the the                            cecum, identified by appendiceal orifice and                            ileocecal valve. The colonoscopy was performed                            without difficulty. The patient tolerated the                            procedure well. The quality of the bowel                            preparation was adequate to identify polyps 6 mm                            and larger in size. The ileocecal valve,  appendiceal orifice, and rectum were photographed. Scope In: 8:11:23 AM Scope Out: 8:44:35 AM Scope Withdrawal Time: 0 hours 22 minutes 37 seconds  Total Procedure Duration: 0 hours 33 minutes 12 seconds  Findings:                 The perianal and digital rectal examinations were                            normal.                           Two sessile polyps were found in the rectum. The                            polyps were 3 to 5 mm in size. These polyps were                            removed with a cold snare. Resection and retrieval                            were complete.                           Multiple small and large-mouthed diverticula were                            found in the sigmoid colon.                           External and internal hemorrhoids were found during                            retroflexion. The hemorrhoids were medium-sized. Complications:            No immediate complications. Estimated Blood Loss:     Estimated blood loss was  minimal. Impression:               - Two 3 to 5 mm polyps in the rectum, removed with                            a cold snare. Resected and retrieved.                           - Diverticulosis in the sigmoid colon.                           - External and internal hemorrhoids. Recommendation:           - Patient has a contact number available for                            emergencies. The signs and symptoms of potential                            delayed complications were discussed with the  patient. Return to normal activities tomorrow.                            Written discharge instructions were provided to the                            patient.                           - Resume previous diet.                           - Continue present medications.                           - Await pathology results.                           - Repeat colonoscopy in 5 years for surveillance                            based on pathology results. Mauri Pole, MD 02/22/2021 8:51:53 AM This report has been signed electronically.

## 2021-02-22 NOTE — Progress Notes (Signed)
Called to room to assist during endoscopic procedure.  Patient ID and intended procedure confirmed with present staff. Received instructions for my participation in the procedure from the performing physician.  

## 2021-02-22 NOTE — Progress Notes (Signed)
Approx 0834 pt noted to be gupping frequently.  Abd pressure was needed to get to cecum and continued during withdrawal to keep from coming back too quick per Dr Delane Ginger.  Suction immediately started, sedation halted and abd pressure d/c'd.  Pt allowed to fully wake up to shallow and answer questions.  Just small amount of clear saliva looking fluid in cannister and small wet ring on pillow

## 2021-02-22 NOTE — Progress Notes (Signed)
Report to PACU, RN, vss, BBS= Clear.  

## 2021-02-22 NOTE — Patient Instructions (Signed)
YOU HAD AN ENDOSCOPIC PROCEDURE TODAY AT THE [facility]:   Refer to the procedure report that was given to you for any specific questions about what was found during the examination.  If the procedure report does not answer your questions, please call your gastroenterologist to clarify.  If you requested that your care partner not be given the details of your procedure findings, then the procedure report has been included in a sealed envelope for you to review at your convenience later.  YOU SHOULD EXPECT: Some feelings of bloating in the abdomen. Passage of more gas than usual.  Walking can help get rid of the air that was put into your GI tract during the procedure and reduce the bloating. If you had a lower endoscopy (such as a colonoscopy or flexible sigmoidoscopy) you may notice spotting of blood in your stool or on the toilet paper. If you underwent a bowel prep for your procedure, you may not have a normal bowel movement for a few days.  Please Note:  You might notice some irritation and congestion in your nose or some drainage.  This is from the oxygen used during your procedure.  There is no need for concern and it should clear up in a day or so.  SYMPTOMS TO REPORT IMMEDIATELY:   Following lower endoscopy (colonoscopy or flexible sigmoidoscopy):  Excessive amounts of blood in the stool  Significant tenderness or worsening of abdominal pains  Swelling of the abdomen that is new, acute  Fever of 100F or higher  For urgent or emergent issues, a gastroenterologist can be reached at any hour by calling [phone]. Do not use MyChart messaging for urgent concerns.    DIET:  We do recommend a small meal at first, but then you may proceed to your regular diet.  Drink plenty of fluids but you should avoid alcoholic beverages for 24 hours.  ACTIVITY:  You should plan to take it easy for the rest of today and you should NOT DRIVE or use heavy machinery until tomorrow (because  of the sedation medicines used during the test).    FOLLOW UP: Our staff will call the number listed on your records 48-72 hours following your procedure to check on you and address any questions or concerns that you may have regarding the information given to you following your procedure. If we do not reach you, we will leave a message.  We will attempt to reach you two times.  During this call, we will ask if you have developed any symptoms of COVID 19. If you develop any symptoms (ie: fever, flu-like symptoms, shortness of breath, cough etc.) before then, please call [phone].  If you test positive for Covid 19 in the 2 weeks post procedure, please call and report this information to us.    If any biopsies were taken you will be contacted by phone or by letter within the next 1-3 weeks.  Please call us at [phone] if you have not heard about the biopsies in 3 weeks.    SIGNATURES/CONFIDENTIALITY: You and/or your care partner have signed paperwork which will be entered into your electronic medical record.  These signatures attest to the fact that that the information above on your After Visit Summary has been reviewed and is understood.  Full responsibility of the confidentiality of this discharge information lies with you and/or your care-partner. 

## 2021-02-22 NOTE — Progress Notes (Signed)
VS- Fred [name]  Pt's states no medical or surgical changes since previsit or office visit.  

## 2021-02-25 DIAGNOSIS — I10 Essential (primary) hypertension: Secondary | ICD-10-CM | POA: Diagnosis not present

## 2021-02-25 DIAGNOSIS — E782 Mixed hyperlipidemia: Secondary | ICD-10-CM | POA: Diagnosis not present

## 2021-02-26 ENCOUNTER — Telehealth: Payer: Self-pay | Admitting: *Deleted

## 2021-02-26 DIAGNOSIS — R011 Cardiac murmur, unspecified: Secondary | ICD-10-CM | POA: Diagnosis not present

## 2021-02-26 DIAGNOSIS — E7849 Other hyperlipidemia: Secondary | ICD-10-CM | POA: Diagnosis not present

## 2021-02-26 DIAGNOSIS — G4730 Sleep apnea, unspecified: Secondary | ICD-10-CM | POA: Diagnosis not present

## 2021-02-26 DIAGNOSIS — R7309 Other abnormal glucose: Secondary | ICD-10-CM | POA: Diagnosis not present

## 2021-02-26 DIAGNOSIS — I10 Essential (primary) hypertension: Secondary | ICD-10-CM | POA: Diagnosis not present

## 2021-02-26 DIAGNOSIS — Z6833 Body mass index (BMI) 33.0-33.9, adult: Secondary | ICD-10-CM | POA: Diagnosis not present

## 2021-02-26 DIAGNOSIS — E782 Mixed hyperlipidemia: Secondary | ICD-10-CM | POA: Diagnosis not present

## 2021-02-26 DIAGNOSIS — Z0001 Encounter for general adult medical examination with abnormal findings: Secondary | ICD-10-CM | POA: Diagnosis not present

## 2021-02-26 DIAGNOSIS — Z1331 Encounter for screening for depression: Secondary | ICD-10-CM | POA: Diagnosis not present

## 2021-02-26 DIAGNOSIS — Z1389 Encounter for screening for other disorder: Secondary | ICD-10-CM | POA: Diagnosis not present

## 2021-02-26 DIAGNOSIS — E6609 Other obesity due to excess calories: Secondary | ICD-10-CM | POA: Diagnosis not present

## 2021-02-26 NOTE — Telephone Encounter (Signed)
  Follow up Call-  Call back number 02/22/2021  Post procedure Call Back phone  # [phone]  Permission to leave phone message Yes  Some recent data might be hidden     Patient questions:  Do you have a fever, pain , or abdominal swelling? No. Pain Score  0 *  Have you tolerated food without any problems? Yes.    Have you been able to return to your normal activities? Yes.    Do you have any questions about your discharge instructions: Diet   No. Medications  No. Follow up visit  No.  Do you have questions or concerns about your Care? No.  Actions: * If pain score is 4 or above: No action needed, pain <4.  Have you developed a fever since your procedure? no  2.   Have you had an respiratory symptoms (SOB or cough) since your procedure? no  3.   Have you tested positive for COVID 19 since your procedure no  4.   Have you had any family members/close contacts diagnosed with the COVID 19 since your procedure?  no   If yes to any of these questions please route to Joylene John, RN and Joella Prince, RN

## 2021-03-15 ENCOUNTER — Encounter: Payer: Self-pay | Admitting: Gastroenterology

## 2021-03-28 DIAGNOSIS — J069 Acute upper respiratory infection, unspecified: Secondary | ICD-10-CM | POA: Diagnosis not present

## 2021-03-28 DIAGNOSIS — J209 Acute bronchitis, unspecified: Secondary | ICD-10-CM | POA: Diagnosis not present

## 2021-03-28 DIAGNOSIS — Z681 Body mass index (BMI) 19 or less, adult: Secondary | ICD-10-CM | POA: Diagnosis not present

## 2021-04-27 DIAGNOSIS — I10 Essential (primary) hypertension: Secondary | ICD-10-CM | POA: Diagnosis not present

## 2021-04-27 DIAGNOSIS — E782 Mixed hyperlipidemia: Secondary | ICD-10-CM | POA: Diagnosis not present

## 2021-05-17 DIAGNOSIS — H6592 Unspecified nonsuppurative otitis media, left ear: Secondary | ICD-10-CM | POA: Diagnosis not present

## 2021-05-17 DIAGNOSIS — Z6830 Body mass index (BMI) 30.0-30.9, adult: Secondary | ICD-10-CM | POA: Diagnosis not present

## 2021-05-17 DIAGNOSIS — J069 Acute upper respiratory infection, unspecified: Secondary | ICD-10-CM | POA: Diagnosis not present

## 2021-05-17 DIAGNOSIS — E6609 Other obesity due to excess calories: Secondary | ICD-10-CM | POA: Diagnosis not present

## 2021-05-21 DIAGNOSIS — Z6833 Body mass index (BMI) 33.0-33.9, adult: Secondary | ICD-10-CM | POA: Diagnosis not present

## 2021-05-21 DIAGNOSIS — Z23 Encounter for immunization: Secondary | ICD-10-CM | POA: Diagnosis not present

## 2021-05-21 DIAGNOSIS — E6609 Other obesity due to excess calories: Secondary | ICD-10-CM | POA: Diagnosis not present

## 2021-05-21 DIAGNOSIS — H9193 Unspecified hearing loss, bilateral: Secondary | ICD-10-CM | POA: Diagnosis not present

## 2021-08-02 ENCOUNTER — Other Ambulatory Visit: Payer: Self-pay | Admitting: Family Medicine

## 2021-08-02 ENCOUNTER — Other Ambulatory Visit (HOSPITAL_COMMUNITY): Payer: Self-pay | Admitting: Family Medicine

## 2021-08-02 DIAGNOSIS — R310 Gross hematuria: Secondary | ICD-10-CM

## 2021-08-02 DIAGNOSIS — R7309 Other abnormal glucose: Secondary | ICD-10-CM | POA: Diagnosis not present

## 2021-08-02 DIAGNOSIS — Z6833 Body mass index (BMI) 33.0-33.9, adult: Secondary | ICD-10-CM | POA: Diagnosis not present

## 2021-08-02 DIAGNOSIS — E669 Obesity, unspecified: Secondary | ICD-10-CM | POA: Diagnosis not present

## 2021-08-08 DIAGNOSIS — H6983 Other specified disorders of Eustachian tube, bilateral: Secondary | ICD-10-CM | POA: Diagnosis not present

## 2021-08-08 DIAGNOSIS — H6123 Impacted cerumen, bilateral: Secondary | ICD-10-CM | POA: Diagnosis not present

## 2021-08-08 DIAGNOSIS — H903 Sensorineural hearing loss, bilateral: Secondary | ICD-10-CM | POA: Diagnosis not present

## 2021-08-08 DIAGNOSIS — R42 Dizziness and giddiness: Secondary | ICD-10-CM | POA: Diagnosis not present

## 2021-08-08 DIAGNOSIS — H6523 Chronic serous otitis media, bilateral: Secondary | ICD-10-CM | POA: Diagnosis not present

## 2021-08-15 DIAGNOSIS — R710 Precipitous drop in hematocrit: Secondary | ICD-10-CM | POA: Diagnosis not present

## 2021-09-12 DIAGNOSIS — H903 Sensorineural hearing loss, bilateral: Secondary | ICD-10-CM | POA: Diagnosis not present

## 2021-09-12 DIAGNOSIS — H6983 Other specified disorders of Eustachian tube, bilateral: Secondary | ICD-10-CM | POA: Diagnosis not present

## 2022-03-05 DIAGNOSIS — G4733 Obstructive sleep apnea (adult) (pediatric): Secondary | ICD-10-CM | POA: Diagnosis not present

## 2022-03-05 DIAGNOSIS — G4730 Sleep apnea, unspecified: Secondary | ICD-10-CM | POA: Diagnosis not present

## 2022-03-11 DIAGNOSIS — U071 COVID-19: Secondary | ICD-10-CM | POA: Diagnosis not present

## 2022-03-21 DIAGNOSIS — L918 Other hypertrophic disorders of the skin: Secondary | ICD-10-CM | POA: Diagnosis not present

## 2022-03-21 DIAGNOSIS — B353 Tinea pedis: Secondary | ICD-10-CM | POA: Diagnosis not present

## 2022-03-21 DIAGNOSIS — D485 Neoplasm of uncertain behavior of skin: Secondary | ICD-10-CM | POA: Diagnosis not present

## 2022-03-21 DIAGNOSIS — D1801 Hemangioma of skin and subcutaneous tissue: Secondary | ICD-10-CM | POA: Diagnosis not present

## 2022-03-21 DIAGNOSIS — L820 Inflamed seborrheic keratosis: Secondary | ICD-10-CM | POA: Diagnosis not present

## 2022-03-21 DIAGNOSIS — L821 Other seborrheic keratosis: Secondary | ICD-10-CM | POA: Diagnosis not present

## 2022-03-21 DIAGNOSIS — D225 Melanocytic nevi of trunk: Secondary | ICD-10-CM | POA: Diagnosis not present

## 2022-06-18 DIAGNOSIS — G4730 Sleep apnea, unspecified: Secondary | ICD-10-CM | POA: Diagnosis not present

## 2022-06-18 DIAGNOSIS — E6609 Other obesity due to excess calories: Secondary | ICD-10-CM | POA: Diagnosis not present

## 2022-06-18 DIAGNOSIS — E7849 Other hyperlipidemia: Secondary | ICD-10-CM | POA: Diagnosis not present

## 2022-06-18 DIAGNOSIS — Z6834 Body mass index (BMI) 34.0-34.9, adult: Secondary | ICD-10-CM | POA: Diagnosis not present

## 2022-06-18 DIAGNOSIS — Z1331 Encounter for screening for depression: Secondary | ICD-10-CM | POA: Diagnosis not present

## 2022-06-18 DIAGNOSIS — E119 Type 2 diabetes mellitus without complications: Secondary | ICD-10-CM | POA: Diagnosis not present

## 2022-06-18 DIAGNOSIS — I10 Essential (primary) hypertension: Secondary | ICD-10-CM | POA: Diagnosis not present

## 2022-06-18 DIAGNOSIS — Z0001 Encounter for general adult medical examination with abnormal findings: Secondary | ICD-10-CM | POA: Diagnosis not present

## 2022-06-18 DIAGNOSIS — E782 Mixed hyperlipidemia: Secondary | ICD-10-CM | POA: Diagnosis not present

## 2022-10-17 ENCOUNTER — Inpatient Hospital Stay (HOSPITAL_COMMUNITY)
Admission: RE | Admit: 2022-10-17 | Discharge: 2022-10-18 | DRG: 322 | Disposition: A | Discharge status: Home / Self Care | Payer: PPO | Attending: Cardiology | Admitting: Cardiology

## 2022-10-17 ENCOUNTER — Encounter (HOSPITAL_COMMUNITY)
Admission: RE | Disposition: A | Discharge status: Home / Self Care | Payer: Self-pay | Source: Home / Self Care | Attending: Cardiology

## 2022-10-17 ENCOUNTER — Encounter (HOSPITAL_COMMUNITY): Payer: Self-pay | Admitting: Cardiology

## 2022-10-17 ENCOUNTER — Ambulatory Visit (HOSPITAL_COMMUNITY): Admit: 2022-10-17 | Payer: PPO | Admitting: Cardiology

## 2022-10-17 ENCOUNTER — Other Ambulatory Visit (HOSPITAL_COMMUNITY): Payer: Self-pay

## 2022-10-17 ENCOUNTER — Inpatient Hospital Stay (HOSPITAL_COMMUNITY): Payer: PPO

## 2022-10-17 DIAGNOSIS — I2489 Other forms of acute ischemic heart disease: Secondary | ICD-10-CM | POA: Diagnosis not present

## 2022-10-17 DIAGNOSIS — E669 Obesity, unspecified: Secondary | ICD-10-CM | POA: Diagnosis not present

## 2022-10-17 DIAGNOSIS — Z7982 Long term (current) use of aspirin: Secondary | ICD-10-CM | POA: Diagnosis not present

## 2022-10-17 DIAGNOSIS — Z6830 Body mass index (BMI) 30.0-30.9, adult: Secondary | ICD-10-CM

## 2022-10-17 DIAGNOSIS — R61 Generalized hyperhidrosis: Secondary | ICD-10-CM | POA: Diagnosis not present

## 2022-10-17 DIAGNOSIS — I10 Essential (primary) hypertension: Secondary | ICD-10-CM | POA: Diagnosis not present

## 2022-10-17 DIAGNOSIS — Z83719 Family history of colon polyps, unspecified: Secondary | ICD-10-CM | POA: Diagnosis not present

## 2022-10-17 DIAGNOSIS — G4733 Obstructive sleep apnea (adult) (pediatric): Secondary | ICD-10-CM | POA: Diagnosis not present

## 2022-10-17 DIAGNOSIS — Z800 Family history of malignant neoplasm of digestive organs: Secondary | ICD-10-CM

## 2022-10-17 DIAGNOSIS — R7303 Prediabetes: Secondary | ICD-10-CM | POA: Insufficient documentation

## 2022-10-17 DIAGNOSIS — E785 Hyperlipidemia, unspecified: Secondary | ICD-10-CM | POA: Insufficient documentation

## 2022-10-17 DIAGNOSIS — E782 Mixed hyperlipidemia: Secondary | ICD-10-CM | POA: Diagnosis not present

## 2022-10-17 DIAGNOSIS — R079 Chest pain, unspecified: Secondary | ICD-10-CM | POA: Diagnosis present

## 2022-10-17 DIAGNOSIS — Z8249 Family history of ischemic heart disease and other diseases of the circulatory system: Secondary | ICD-10-CM | POA: Diagnosis not present

## 2022-10-17 DIAGNOSIS — I213 ST elevation (STEMI) myocardial infarction of unspecified site: Secondary | ICD-10-CM | POA: Diagnosis not present

## 2022-10-17 DIAGNOSIS — I499 Cardiac arrhythmia, unspecified: Secondary | ICD-10-CM | POA: Diagnosis not present

## 2022-10-17 DIAGNOSIS — R110 Nausea: Secondary | ICD-10-CM | POA: Diagnosis not present

## 2022-10-17 DIAGNOSIS — Z79899 Other long term (current) drug therapy: Secondary | ICD-10-CM

## 2022-10-17 DIAGNOSIS — I2102 ST elevation (STEMI) myocardial infarction involving left anterior descending coronary artery: Secondary | ICD-10-CM | POA: Diagnosis not present

## 2022-10-17 DIAGNOSIS — I2510 Atherosclerotic heart disease of native coronary artery without angina pectoris: Secondary | ICD-10-CM | POA: Diagnosis present

## 2022-10-17 DIAGNOSIS — K219 Gastro-esophageal reflux disease without esophagitis: Secondary | ICD-10-CM | POA: Diagnosis not present

## 2022-10-17 DIAGNOSIS — Z955 Presence of coronary angioplasty implant and graft: Secondary | ICD-10-CM

## 2022-10-17 HISTORY — PX: LEFT HEART CATH AND CORONARY ANGIOGRAPHY: CATH118249

## 2022-10-17 HISTORY — PX: CORONARY/GRAFT ACUTE MI REVASCULARIZATION: CATH118305

## 2022-10-17 LAB — POCT I-STAT, CHEM 8
BUN: 10 mg/dL (ref 8–23)
Calcium, Ion: 1.15 mmol/L (ref 1.15–1.40)
Chloride: 100 mmol/L (ref 98–111)
Creatinine, Ser: 0.7 mg/dL (ref 0.61–1.24)
Glucose, Bld: 148 mg/dL — ABNORMAL HIGH (ref 70–99)
HCT: 39 % (ref 39.0–52.0)
Hemoglobin: 13.3 g/dL (ref 13.0–17.0)
Potassium: 2.9 mmol/L — ABNORMAL LOW (ref 3.5–5.1)
Sodium: 138 mmol/L (ref 135–145)
TCO2: 23 mmol/L (ref 22–32)

## 2022-10-17 LAB — LIPID PANEL
Cholesterol: 124 mg/dL (ref 0–200)
HDL: 26 mg/dL — ABNORMAL LOW (ref >40)
LDL Cholesterol: 80 mg/dL (ref 0–99)
Total CHOL/HDL Ratio: 4.8 RATIO
Triglycerides: 91 mg/dL (ref <150)
VLDL: 18 mg/dL (ref 0–40)

## 2022-10-17 LAB — CBC WITH DIFFERENTIAL/PLATELET
Abs Immature Granulocytes: 0.02 10*3/uL (ref 0.00–0.07)
Basophils Absolute: 0.1 10*3/uL (ref 0.0–0.1)
Basophils Relative: 1 %
Eosinophils Absolute: 0.4 10*3/uL (ref 0.0–0.5)
Eosinophils Relative: 6 %
HCT: 38.7 % — ABNORMAL LOW (ref 39.0–52.0)
Hemoglobin: 13.5 g/dL (ref 13.0–17.0)
Immature Granulocytes: 0 %
Lymphocytes Relative: 27 %
Lymphs Abs: 2 10*3/uL (ref 0.7–4.0)
MCH: 31 pg (ref 26.0–34.0)
MCHC: 34.9 g/dL (ref 30.0–36.0)
MCV: 89 fL (ref 80.0–100.0)
Monocytes Absolute: 0.6 10*3/uL (ref 0.1–1.0)
Monocytes Relative: 9 %
Neutro Abs: 4.1 10*3/uL (ref 1.7–7.7)
Neutrophils Relative %: 57 %
Platelets: 201 10*3/uL (ref 150–400)
RBC: 4.35 MIL/uL (ref 4.22–5.81)
RDW: 13.2 % (ref 11.5–15.5)
WBC: 7.2 10*3/uL (ref 4.0–10.5)
nRBC: 0 % (ref 0.0–0.2)

## 2022-10-17 LAB — COMPREHENSIVE METABOLIC PANEL
ALT: 21 U/L (ref 0–44)
AST: 21 U/L (ref 15–41)
Albumin: 3.2 g/dL — ABNORMAL LOW (ref 3.5–5.0)
Alkaline Phosphatase: 64 U/L (ref 38–126)
Anion gap: 7 (ref 5–15)
BUN: 7 mg/dL — ABNORMAL LOW (ref 8–23)
CO2: 23 mmol/L (ref 22–32)
Calcium: 7.5 mg/dL — ABNORMAL LOW (ref 8.9–10.3)
Chloride: 103 mmol/L (ref 98–111)
Creatinine, Ser: 0.84 mg/dL (ref 0.61–1.24)
GFR, Estimated: >60 mL/min (ref >60)
Glucose, Bld: 159 mg/dL — ABNORMAL HIGH (ref 70–99)
Potassium: 2.8 mmol/L — ABNORMAL LOW (ref 3.5–5.1)
Sodium: 133 mmol/L — ABNORMAL LOW (ref 135–145)
Total Bilirubin: 1.2 mg/dL (ref 0.3–1.2)
Total Protein: 5.9 g/dL — ABNORMAL LOW (ref 6.5–8.1)

## 2022-10-17 LAB — HEMOGLOBIN A1C
Hgb A1c MFr Bld: 5.7 % — ABNORMAL HIGH (ref 4.8–5.6)
Mean Plasma Glucose: 117 mg/dL

## 2022-10-17 LAB — TROPONIN I (HIGH SENSITIVITY)
Troponin I (High Sensitivity): 21 ng/L — ABNORMAL HIGH (ref <18)
Troponin I (High Sensitivity): 1117 ng/L (ref <18)
Troponin I (High Sensitivity): 4208 ng/L (ref <18)

## 2022-10-17 LAB — ECHOCARDIOGRAM COMPLETE
Area-P 1/2: 3.53 cm2
Height: 69 in
S' Lateral: 3.5 cm
Weight: 3350.99 oz

## 2022-10-17 LAB — HIV ANTIBODY (ROUTINE TESTING W REFLEX): HIV Screen 4th Generation wRfx: NONREACTIVE

## 2022-10-17 LAB — POCT ACTIVATED CLOTTING TIME: Activated Clotting Time: 428 seconds

## 2022-10-17 LAB — APTT: aPTT: >200 seconds (ref 24–36)

## 2022-10-17 LAB — MRSA NEXT GEN BY PCR, NASAL: MRSA by PCR Next Gen: NOT DETECTED

## 2022-10-17 LAB — MAGNESIUM: Magnesium: 1.8 mg/dL (ref 1.7–2.4)

## 2022-10-17 LAB — PROTIME-INR
INR: 1.6 — ABNORMAL HIGH (ref 0.8–1.2)
Prothrombin Time: 18.5 seconds — ABNORMAL HIGH (ref 11.4–15.2)

## 2022-10-17 SURGERY — LEFT HEART CATH AND CORONARY ANGIOGRAPHY
Anesthesia: LOCAL

## 2022-10-17 MED ORDER — TICAGRELOR 90 MG PO TABS
ORAL_TABLET | ORAL | Status: DC | PRN
  Administered 2022-10-17: 180 mg via ORAL

## 2022-10-17 MED ORDER — IOHEXOL 350 MG/ML SOLN
INTRAVENOUS | Status: DC | PRN
  Administered 2022-10-17: 110 mL

## 2022-10-17 MED ORDER — METOPROLOL SUCCINATE ER 25 MG PO TB24
12.5000 mg | ORAL_TABLET | Freq: Every day | ORAL | Status: DC
  Administered 2022-10-17 – 2022-10-18 (×2): 12.5 mg via ORAL
  Filled 2022-10-17 (×2): qty 1

## 2022-10-17 MED ORDER — HYDRALAZINE HCL 20 MG/ML IJ SOLN
INTRAMUSCULAR | Status: AC
  Administered 2022-10-17: 10 mg via INTRAVENOUS
  Filled 2022-10-17: qty 1

## 2022-10-17 MED ORDER — SODIUM CHLORIDE 0.9 % IV SOLN: 250.0000 mL | INTRAVENOUS | Status: DC | PRN

## 2022-10-17 MED ORDER — SODIUM CHLORIDE 0.9% FLUSH: 3.0000 mL | INTRAVENOUS | Status: DC | PRN

## 2022-10-17 MED ORDER — CHLORHEXIDINE GLUCONATE CLOTH 2 % EX PADS
6.0000 | MEDICATED_PAD | Freq: Every day | CUTANEOUS | Status: DC
  Administered 2022-10-18: 6 via TOPICAL

## 2022-10-17 MED ORDER — ACETAMINOPHEN 325 MG PO TABS: 650.0000 mg | ORAL_TABLET | ORAL | Status: DC | PRN

## 2022-10-17 MED ORDER — ENOXAPARIN SODIUM 40 MG/0.4ML IJ SOSY: 40.0000 mg | PREFILLED_SYRINGE | INTRAMUSCULAR | Status: DC

## 2022-10-17 MED ORDER — ONDANSETRON HCL 4 MG/2ML IJ SOLN
4.0000 mg | Freq: Four times a day (QID) | INTRAMUSCULAR | Status: DC | PRN
  Administered 2022-10-17: 4 mg via INTRAVENOUS
  Filled 2022-10-17: qty 2

## 2022-10-17 MED ORDER — ASPIRIN 81 MG PO TBEC
81.0000 mg | DELAYED_RELEASE_TABLET | Freq: Every day | ORAL | Status: DC
  Administered 2022-10-18: 81 mg via ORAL
  Filled 2022-10-17: qty 1

## 2022-10-17 MED ORDER — HEPARIN (PORCINE) IN NACL 1000-0.9 UT/500ML-% IV SOLN
INTRAVENOUS | Status: DC | PRN
  Administered 2022-10-17 (×2): 500 mL

## 2022-10-17 MED ORDER — POTASSIUM CHLORIDE CRYS ER 20 MEQ PO TBCR
60.0000 meq | EXTENDED_RELEASE_TABLET | Freq: Once | ORAL | Status: AC
  Administered 2022-10-17: 60 meq via ORAL
  Filled 2022-10-17: qty 3

## 2022-10-17 MED ORDER — LIDOCAINE HCL (PF) 1 % IJ SOLN
INTRAMUSCULAR | Status: AC
  Filled 2022-10-17: qty 30

## 2022-10-17 MED ORDER — ROSUVASTATIN CALCIUM 20 MG PO TABS
40.0000 mg | ORAL_TABLET | Freq: Every day | ORAL | Status: DC
  Administered 2022-10-17 – 2022-10-18 (×2): 40 mg via ORAL
  Filled 2022-10-17 (×2): qty 2

## 2022-10-17 MED ORDER — HEPARIN SODIUM (PORCINE) 1000 UNIT/ML IJ SOLN
INTRAMUSCULAR | Status: AC
  Filled 2022-10-17: qty 10

## 2022-10-17 MED ORDER — HYDRALAZINE HCL 20 MG/ML IJ SOLN
10.0000 mg | Freq: Three times a day (TID) | INTRAMUSCULAR | Status: DC | PRN
  Administered 2022-10-17 – 2022-10-18 (×2): 10 mg via INTRAVENOUS
  Filled 2022-10-17 (×2): qty 1

## 2022-10-17 MED ORDER — NITROGLYCERIN 0.4 MG SL SUBL: 0.4000 mg | SUBLINGUAL_TABLET | SUBLINGUAL | Status: DC | PRN

## 2022-10-17 MED ORDER — VERAPAMIL HCL 2.5 MG/ML IV SOLN
INTRAVENOUS | Status: DC | PRN
  Administered 2022-10-17: 10 mL via INTRA_ARTERIAL

## 2022-10-17 MED ORDER — HEPARIN SODIUM (PORCINE) 1000 UNIT/ML IJ SOLN
INTRAMUSCULAR | Status: DC | PRN
  Administered 2022-10-17: 10000 [IU] via INTRAVENOUS

## 2022-10-17 MED ORDER — TICAGRELOR 90 MG PO TABS
90.0000 mg | ORAL_TABLET | Freq: Two times a day (BID) | ORAL | Status: DC
  Administered 2022-10-17 – 2022-10-18 (×2): 90 mg via ORAL
  Filled 2022-10-17 (×2): qty 1

## 2022-10-17 MED ORDER — AMIODARONE HCL 150 MG/3ML IV SOLN
INTRAVENOUS | Status: DC | PRN
  Administered 2022-10-17: 150 mg via INTRAVENOUS

## 2022-10-17 MED ORDER — AMIODARONE HCL 150 MG/3ML IV SOLN
INTRAVENOUS | Status: AC
  Filled 2022-10-17: qty 3

## 2022-10-17 MED ORDER — VERAPAMIL HCL 2.5 MG/ML IV SOLN
INTRAVENOUS | Status: AC
  Filled 2022-10-17: qty 2

## 2022-10-17 MED ORDER — TICAGRELOR 90 MG PO TABS
ORAL_TABLET | ORAL | Status: AC
  Filled 2022-10-17: qty 2

## 2022-10-17 MED ORDER — SODIUM CHLORIDE 0.9% FLUSH
3.0000 mL | Freq: Two times a day (BID) | INTRAVENOUS | Status: DC
  Administered 2022-10-17: 10 mL via INTRAVENOUS
  Administered 2022-10-17 – 2022-10-18 (×2): 3 mL via INTRAVENOUS

## 2022-10-17 MED ORDER — ASPIRIN 81 MG PO CHEW: 81.0000 mg | CHEWABLE_TABLET | Freq: Every day | ORAL | Status: DC

## 2022-10-17 MED ORDER — LIDOCAINE HCL (PF) 1 % IJ SOLN
INTRAMUSCULAR | Status: DC | PRN
  Administered 2022-10-17: 2 mL

## 2022-10-17 SURGICAL SUPPLY — 21 items
BALL SAPPHIRE NC24 2.75X15 (BALLOONS) ×1
BALLN SAPPHIRE 2.0X12 (BALLOONS) ×1
BALLOON SAPPHIRE 2.0X12 (BALLOONS) IMPLANT
BALLOON SAPPHIRE NC24 2.75X15 (BALLOONS) IMPLANT
CATH 5FR JL3.5 JR4 ANG PIG MP (CATHETERS) IMPLANT
CATH VISTA GUIDE 6FR XBLAD3.5 (CATHETERS) IMPLANT
DEVICE RAD COMP TR BAND LRG (VASCULAR PRODUCTS) IMPLANT
ELECT DEFIB PAD ADLT CADENCE (PAD) IMPLANT
GLIDESHEATH SLEND SS 6F .021 (SHEATH) IMPLANT
GUIDEWIRE INQWIRE 1.5J.035X260 (WIRE) IMPLANT
INQWIRE 1.5J .035X260CM (WIRE) ×1
KIT ENCORE 26 ADVANTAGE (KITS) IMPLANT
KIT HEART LEFT (KITS) ×1 IMPLANT
KIT HEMO VALVE WATCHDOG (MISCELLANEOUS) IMPLANT
PACK CARDIAC CATHETERIZATION (CUSTOM PROCEDURE TRAY) ×1 IMPLANT
STENT SYNERGY XD 2.50X20 (Permanent Stent) IMPLANT
SYNERGY XD 2.50X20 (Permanent Stent) ×1 IMPLANT
TRANSDUCER W/STOPCOCK (MISCELLANEOUS) ×1 IMPLANT
TUBING CIL FLEX 10 FLL-RA (TUBING) ×1 IMPLANT
WIRE ASAHI PROWATER 180CM (WIRE) IMPLANT
WIRE MICROINTRODUCER 60CM (WIRE) IMPLANT

## 2022-10-17 NOTE — Discharge Instructions (Signed)

## 2022-10-17 NOTE — TOC CM/SW Note (Signed)
CM spoke to pt and lives with wife. Will need his Worker's Comp completed. Wife to bring paperwork. [place], Heart Failure TOC CM [phone]

## 2022-10-17 NOTE — TOC Benefit Eligibility Note (Signed)
Patient Teacher, English as a foreign language completed.    The patient is currently admitted and upon discharge could be taking Brilinta 90 mg.  The current 30 day co-pay is $47.00.   The patient is insured through [employer] Part D   This test claim was processed through Price amounts may vary at other pharmacies due to pharmacy/plan contracts, or as the patient moves through the different stages of their insurance plan.  Lyndel Safe, [place] Patient Advocate Specialist [place] Patient Advocate Team Direct Number: [phone]  Fax: [phone]

## 2022-10-17 NOTE — H&P (Signed)
Cardiology Admission History and Physical   Patient ID: Fred [name] MRN: [id number]; DOB: Nov 22, 1950   Admission date: 10/17/2022  PCP:  [place], Peter M, MD   [place] Providers Cardiologist:  New  Chief Complaint:  STEMI   Patient Profile:   Fred [name] is a 72 y.o. male with PMH of HTN, GERD, OSA who is being seen 10/17/2022 for the evaluation of STEMI.  History of Present Illness:   Fred [name] is a 72 yo male with PMH of HTN. Non-smoker, does report family hx of father having MI in his 92s. He is a retired Airline pilot. Sees a PCP on regular basis. Was out for a walk this morning and developed weakness, shortness of breath. No chest pain. Came back home and called 911. On arrival EKG showed sinus rhythm, 64 bpm ST elevation in lead I, aVF, v3 with ST depression in inferolateral leads. CODE STEMI was called. Received 324 ASA and 500cc fluids while en route. Brought directly to the cath lab for emergent cardiac cath.    Past Medical History:  Diagnosis Date   Contact lens/glasses fitting    GERD (gastroesophageal reflux disease)    has also had esoph dilated   Hypertension    Sleep apnea    wears a cpap-has for 5 yr-will wear post op    Past Surgical History:  Procedure Laterality Date   COLONOSCOPY     ESOPHAGEAL DILATION  2003   for diverticum in esophageal   INGUINAL HERNIA REPAIR  03/05/2012   Procedure: LAPAROSCOPIC INGUINAL HERNIA;  Surgeon: Harl Bowie, MD;  Location: [facility];  Service: General;  Laterality: Right;   UPPER GASTROINTESTINAL ENDOSCOPY     removal FB from esoph     Medications Prior to Admission: Prior to Admission medications   Medication Sig Start Date End Date Taking? Authorizing Provider  amLODipine (NORVASC) 10 MG tablet TK 1 T PO HS 04/09/17   [provider]  aspirin 81 MG tablet Take 81 mg by mouth daily.    [provider]  CINNAMON PO Take 1,000 mg by mouth daily.     [provider]  fish oil-omega-3 fatty acids 1000 MG capsule Take 1 g 2 (two) times daily after a meal by mouth.     [provider]  Multiple Vitamins-Minerals (MULTIVITAMIN WITH MINERALS) tablet Take 1 tablet by mouth daily.    [provider]  olmesartan-hydrochlorothiazide (BENICAR HCT) 40-25 MG tablet TK 1 T PO D 04/09/17   [provider]  omeprazole (PRILOSEC) 40 MG capsule Take 40 mg by mouth daily.     [provider]  Potassium 99 MG TABS Take by mouth daily.    [provider]     Allergies:   No Known Allergies  Social History:   Social History   Socioeconomic History   Marital status: Married    Spouse name: Not on file   Number of children: Not on file   Years of education: Not on file   Highest education level: Not on file  Occupational History   Not on file  Tobacco Use   Smoking status: Never   Smokeless tobacco: Never  Vaping Use   Vaping Use: Never used  Substance and Sexual Activity   Alcohol use: No   Drug use: No   Sexual activity: Not on file  Other Topics Concern   Not on file  Social History Narrative   Not on file   Social  Determinants of Health   Financial Resource Strain: Not on file  Food Insecurity: Not on file  Transportation Needs: Not on file  Physical Activity: Not on file  Stress: Not on file  Social Connections: Not on file  Intimate Partner Violence: Not on file    Family History:   The patient's family history includes Cancer in his father; Colon cancer in his father; Colon polyps in his brother; Heart disease in his father. There is no history of Esophageal cancer, Rectal cancer, or Stomach cancer.    ROS:  Please see the history of present illness.  All other ROS reviewed and negative.     Physical Exam/Data:   Vitals:   10/17/22 0900 10/17/22 0911 10/17/22 0912  SpO2:   99%  Weight:  95 kg   Height: 5\' 9"  (1.753 m)     No intake or output data in the 24 hours  ending 10/17/22 0922    10/17/2022    9:11 AM 02/22/2021    7:24 AM 02/08/2021    7:59 AM  Last 3 Weights  Weight (lbs) 209 lb 7 oz 225 lb 3.2 oz 225 lb 3.2 oz  Weight (kg) 95 kg 102.15 kg 102.15 kg     Body mass index is 30.93 kg/m.  General:  Pale, slightly diaphoretic HEENT: normal Neck: no JVD Vascular: No carotid bruits; Distal pulses 2+ bilaterally   Cardiac:  normal S1, S2; RRR; no murmur  Lungs:  clear to auscultation bilaterally, no wheezing, rhonchi or rales  Abd: soft, nontender, no hepatomegaly  Ext: no edema Musculoskeletal:  No deformities, BUE and BLE strength normal and equal Skin: warm and dry  Neuro:  CNs 2-12 intact, no focal abnormalities noted Psych:  Normal affect    EKG:  The ECG that was done 3/21 was personally reviewed and demonstrates sinus rhythm with 64 bpm ST elevation in lead I, aVF, v3 with ST depression in inferolateral leads  Relevant CV Studies:  N/a   Laboratory Data:  High Sensitivity Troponin:  No results for input(s): "TROPONINIHS" in the last 720 hours.    ChemistryNo results for input(s): "NA", "K", "CL", "CO2", "GLUCOSE", "BUN", "CREATININE", "CALCIUM", "MG", "GFRNONAA", "GFRAA", "ANIONGAP" in the last 168 hours.  No results for input(s): "PROT", "ALBUMIN", "AST", "ALT", "ALKPHOS", "BILITOT" in the last 168 hours. Lipids No results for input(s): "CHOL", "TRIG", "HDL", "LABVLDL", "LDLCALC", "CHOLHDL" in the last 168 hours. HematologyNo results for input(s): "WBC", "RBC", "HGB", "HCT", "MCV", "MCH", "MCHC", "RDW", "PLT" in the last 168 hours. Thyroid No results for input(s): "TSH", "FREET4" in the last 168 hours. BNPNo results for input(s): "BNP", "PROBNP" in the last 168 hours.  DDimer No results for input(s): "DDIMER" in the last 168 hours.   Radiology/Studies:  No results found.   Assessment and Plan:   Fred [name] is a 72 y.o. male with PMH of HTN, GERD, OSA who is being seen 10/17/2022 for the evaluation of  STEMI.  Anterolateral STEMI -- was out for a walk this morning when he developed onset of weakness and shortness of breath. On EMS arrival CODE STEMI called with STE in anterior leads and ST depression in inferolateral leads. Given 324 ASA and IVFs en route. Brought directly to the cath lab for emergent cardiac cath.  -- Further recommendations post cath  HTN -- takes amlodipine, Benicar PTA  OSA  Risk Assessment/Risk Scores:   TIMI Risk Score for ST  Elevation MI:   The patient's TIMI risk score is 3, which indicates  a 4.4% risk of all cause mortality at 30 days.{  The appropriate patient status for this patient is INPATIENT. Inpatient status is judged to be reasonable and necessary in order to provide the required intensity of service to ensure the patient's safety. The patient's presenting symptoms, physical exam findings, and initial radiographic and laboratory data in the context of their chronic comorbidities is felt to place them at high risk for further clinical deterioration. Furthermore, it is not anticipated that the patient will be medically stable for discharge from the hospital within 2 midnights of admission.   * I certify that at the point of admission it is my clinical judgment that the patient will require inpatient hospital care spanning beyond 2 midnights from the point of admission due to high intensity of service, high risk for further deterioration and high frequency of surveillance required.*   For questions or updates, please contact [place] Please consult [email or web address] for contact info under     Signed, Reino Bellis, NP  10/17/2022 9:22 AM

## 2022-10-17 NOTE — Progress Notes (Signed)
  Echocardiogram 2D Echocardiogram has been performed.  Fred [name] 10/17/2022, 5:45 PM

## 2022-10-17 NOTE — Progress Notes (Signed)
Worker's Comp form placed on patient's chart for physician signature.

## 2022-10-18 ENCOUNTER — Other Ambulatory Visit (HOSPITAL_COMMUNITY): Payer: Self-pay

## 2022-10-18 DIAGNOSIS — I10 Essential (primary) hypertension: Secondary | ICD-10-CM

## 2022-10-18 DIAGNOSIS — E669 Obesity, unspecified: Secondary | ICD-10-CM

## 2022-10-18 DIAGNOSIS — E785 Hyperlipidemia, unspecified: Secondary | ICD-10-CM | POA: Insufficient documentation

## 2022-10-18 DIAGNOSIS — E782 Mixed hyperlipidemia: Secondary | ICD-10-CM

## 2022-10-18 DIAGNOSIS — R7303 Prediabetes: Secondary | ICD-10-CM | POA: Insufficient documentation

## 2022-10-18 LAB — CBC
HCT: 40.8 % (ref 39.0–52.0)
Hemoglobin: 13.9 g/dL (ref 13.0–17.0)
MCH: 31 pg (ref 26.0–34.0)
MCHC: 34.1 g/dL (ref 30.0–36.0)
MCV: 91.1 fL (ref 80.0–100.0)
Platelets: 213 10*3/uL (ref 150–400)
RBC: 4.48 MIL/uL (ref 4.22–5.81)
RDW: 13.6 % (ref 11.5–15.5)
WBC: 10.2 10*3/uL (ref 4.0–10.5)
nRBC: 0 % (ref 0.0–0.2)

## 2022-10-18 LAB — BASIC METABOLIC PANEL
Anion gap: 8 (ref 5–15)
BUN: 10 mg/dL (ref 8–23)
CO2: 24 mmol/L (ref 22–32)
Calcium: 8.4 mg/dL — ABNORMAL LOW (ref 8.9–10.3)
Chloride: 110 mmol/L (ref 98–111)
Creatinine, Ser: 0.92 mg/dL (ref 0.61–1.24)
GFR, Estimated: >60 mL/min (ref >60)
Glucose, Bld: 112 mg/dL — ABNORMAL HIGH (ref 70–99)
Potassium: 3.2 mmol/L — ABNORMAL LOW (ref 3.5–5.1)
Sodium: 142 mmol/L (ref 135–145)

## 2022-10-18 LAB — LIPID PANEL
Cholesterol: 132 mg/dL (ref 0–200)
HDL: 28 mg/dL — ABNORMAL LOW (ref >40)
LDL Cholesterol: 85 mg/dL (ref 0–99)
Total CHOL/HDL Ratio: 4.7 RATIO
Triglycerides: 95 mg/dL (ref <150)
VLDL: 19 mg/dL (ref 0–40)

## 2022-10-18 MED ORDER — ROSUVASTATIN CALCIUM 40 MG PO TABS
40.0000 mg | ORAL_TABLET | Freq: Every day | ORAL | 1 refills | Status: DC
  Filled 2022-10-18: qty 90, 90d supply, fill #0

## 2022-10-18 MED ORDER — NITROGLYCERIN 0.4 MG SL SUBL
0.4000 mg | SUBLINGUAL_TABLET | SUBLINGUAL | 2 refills | Status: DC | PRN
  Filled 2022-10-18: qty 25, 5d supply, fill #0

## 2022-10-18 MED ORDER — LOSARTAN POTASSIUM 25 MG PO TABS
25.0000 mg | ORAL_TABLET | Freq: Every day | ORAL | Status: DC
  Administered 2022-10-18: 25 mg via ORAL
  Filled 2022-10-18: qty 1

## 2022-10-18 MED ORDER — TICAGRELOR 90 MG PO TABS
90.0000 mg | ORAL_TABLET | Freq: Two times a day (BID) | ORAL | 2 refills | Status: DC
  Filled 2022-10-18: qty 60, 30d supply, fill #0

## 2022-10-18 MED ORDER — METOPROLOL SUCCINATE ER 25 MG PO TB24
12.5000 mg | ORAL_TABLET | Freq: Every day | ORAL | 0 refills | Status: DC
  Filled 2022-10-18: qty 15, 30d supply, fill #0

## 2022-10-18 MED ORDER — POTASSIUM CHLORIDE CRYS ER 20 MEQ PO TBCR
40.0000 meq | EXTENDED_RELEASE_TABLET | Freq: Once | ORAL | Status: AC
  Administered 2022-10-18: 40 meq via ORAL
  Filled 2022-10-18: qty 2

## 2022-10-18 NOTE — Progress Notes (Signed)
Piv dcd, site unremarkable.

## 2022-10-18 NOTE — Discharge Summary (Addendum)
Discharge Summary    Patient ID: Fred [name] MRN: [id number]; DOB: [birth year]  Admit date: 10/17/2022 Discharge date: 10/18/2022  PCP:  Martinique, Peter M, MD   [place] [facility] Cardiologist:  Peter Martinique, MD      Discharge Diagnoses    Principal Problem:   ST elevation myocardial infarction involving left anterior descending (LAD) coronary artery [facility]) Active Problems:   STEMI involving left anterior descending coronary artery [facility])   Hyperlipidemia   Pre-diabetes    Diagnostic Studies/Procedures    Cath: 10/17/2022    Mid LAD-1 lesion is 25% stenosed.   Mid LAD-2 lesion is 100% stenosed.   A drug-eluting stent was successfully placed using a SYNERGY XD 2.50X20.   Post intervention, there is a 0% residual stenosis.   There is mild to moderate left ventricular systolic dysfunction.   LV end diastolic pressure is moderately elevated.   The left ventricular ejection fraction is 45-50% by visual estimate.   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 Risk factor modification. If no complications may be a candidate for fast track DC  Diagnostic Dominance: Right  Intervention   Echo: 10/17/2022  IMPRESSIONS     1. Left ventricular ejection fraction, by estimation, is 60 to 65%. The  left ventricle has normal function. The left ventricle has no regional  wall motion abnormalities. Left ventricular diastolic parameters are  indeterminate.   2. Right ventricular systolic function is normal. The right ventricular  size is normal. Tricuspid regurgitation signal is inadequate for assessing  PA pressure.   3. Left atrial size was mildly dilated.   4. The mitral valve is normal in structure. No evidence of mitral valve  regurgitation. No evidence of mitral stenosis.   5. The aortic valve is normal in structure.  Aortic valve regurgitation is  not visualized. No aortic stenosis is present.   6. The inferior vena cava is normal in size with greater than 50%  respiratory variability, suggesting right atrial pressure of 3 mmHg.   FINDINGS   Left Ventricle: Left ventricular ejection fraction, by estimation, is 60  to 65%. The left ventricle has normal function. The left ventricle has no  regional wall motion abnormalities. The left ventricular internal cavity  size was normal in size. There is   no left ventricular hypertrophy. Left ventricular diastolic parameters  are indeterminate.   Right Ventricle: The right ventricular size is normal. No increase in  right ventricular wall thickness. Right ventricular systolic function is  normal. Tricuspid regurgitation signal is inadequate for assessing PA  pressure.   Left Atrium: Left atrial size was mildly dilated.   Right Atrium: Right atrial size was normal in size.   Pericardium: There is no evidence of pericardial effusion.   Mitral Valve: The mitral valve is normal in structure. No evidence of  mitral valve regurgitation. No evidence of mitral valve stenosis.   Tricuspid Valve: The tricuspid valve is normal in structure. Tricuspid  valve regurgitation is not demonstrated. No evidence of tricuspid  stenosis.   Aortic Valve: The aortic valve is normal in structure. Aortic valve  regurgitation is not visualized. No aortic stenosis is present.   Pulmonic Valve: The pulmonic valve was normal in structure. Pulmonic valve  regurgitation is not visualized. No evidence of pulmonic stenosis.   Aorta: The aortic root is normal in  size and structure.   Venous: The inferior vena cava is normal in size with greater than 50%  respiratory variability, suggesting right atrial pressure of 3 mmHg.   IAS/Shunts: No atrial level shunt detected by color flow Doppler.  _____________   History of Present Illness     Fred [name] is a 72 y.o. male with PMH  of HTN, GERD, OSA who was seen 10/17/2022 for the evaluation of STEMI. Non-smoker, does report family hx of father having MI in his 36s. He is a retired Air[employer] pilot. Sees a PCP on regular basis. Was out for a walk this morning and developed weakness, shortness of breath. No chest pain. Came back home and called 911. On arrival EKG showed sinus rhythm, 64 bpm ST elevation in lead I, aVF, v3 with ST depression in inferolateral leads. CODE STEMI was called. Received 324 ASA and 500cc fluids while en route. Brought directly to the cath lab for emergent cardiac cath.   Hospital Course     Anterolateral STEMI -- was out for a walk the morning of admission when he developed onset of weakness and shortness of breath. On EMS arrival CODE STEMI called with STE in anterior leads and ST depression in inferolateral leads. Given 324 ASA and IVFs en route. Brought directly to the cath lab for emergent cardiac cath noted above with single vessel CAD, 100% occluded mLAD treated with PCI/DES x1. Placed on DAPT with ASA/Brilinta for at least one year. Seen by cardiac rehab and did well.  -- continue ASA, Brilinta, Toprol XL 12.5mg  daily, losartan 25mg  daily    HTN -- elevated on admission, improved -- continue Toprol 12.5mg  daily, resume Benicar at discharge   Hyperlipidemia -- HDL 28, LDL 85, LP (a) pending -- continue Crestor 40mg  daily  -- FLP/LFTs in 8 weeks    PreDM -- Hgb A1c 5.7  OSA  Patient seen by Dr. Irish Lack and deemed stable for discharge home. Follow up arranged in the office. Medications sent to the [facility] pharmacy. Educated by pharmD prior to discharge.  Did the patient have an acute coronary syndrome (MI, NSTEMI, STEMI, etc) this admission?:  Yes                               AHA/ACC Clinical Performance & Quality Measures: Aspirin prescribed? - Yes ADP Receptor Inhibitor (Plavix/Clopidogrel, Brilinta/Ticagrelor or Effient/Prasugrel) prescribed (includes medically managed patients)? - Yes Beta  Blocker prescribed? - Yes High Intensity Statin (Lipitor 40-80mg  or Crestor 20-40mg ) prescribed? - Yes EF assessed during THIS hospitalization? - Yes For EF <40%, was ACEI/ARB prescribed? - Yes For EF <40%, Aldosterone Antagonist (Spironolactone or Eplerenone) prescribed? - Not Applicable (EF >/= [id number]) Cardiac Rehab Phase II ordered (including medically managed patients)? - Yes   The patient will be scheduled for a TOC follow up appointment in 10-14 days.  A message has been sent to the [facility] and Scheduling Pool at the office where the patient should be seen for follow up.  _____________  Discharge Vitals Blood pressure (!) 143/84, pulse 86, temperature 97.9 F (36.6 C), temperature source Oral, resp. rate (!) 21, height 5\' 9"  (1.753 m), weight 95 kg, SpO2 96 %.  Filed Weights   10/17/22 0911  Weight: 95 kg    Labs & Radiologic Studies    CBC Recent Labs    10/17/22 0927 10/17/22 0929 10/18/22 0614  WBC 7.2  --  10.2  NEUTROABS 4.1  --   --  HGB 13.5 13.3 13.9  HCT 38.7* 39.0 40.8  MCV 89.0  --  91.1  PLT 201  --  123456   Basic Metabolic Panel Recent Labs    10/17/22 0927 10/17/22 0929 10/18/22 0614  NA 133* 138 142  K 2.8* 2.9* 3.2*  CL 103 100 110  CO2 23  --  24  GLUCOSE 159* 148* 112*  BUN 7* 10 10  CREATININE 0.84 0.70 0.92  CALCIUM 7.5*  --  8.4*  MG 1.8  --   --    Liver Function Tests Recent Labs    10/17/22 0927  AST 21  ALT 21  ALKPHOS 64  BILITOT 1.2  PROT 5.9*  ALBUMIN 3.2*   No results for input(s): "LIPASE", "AMYLASE" in the last 72 hours. High Sensitivity Troponin:   Recent Labs  Lab 10/17/22 0927 10/17/22 1135 10/17/22 1410  TROPONINIHS 21* 1,117* 4,208*    BNP Invalid input(s): "POCBNP" D-Dimer No results for input(s): "DDIMER" in the last 72 hours. Hemoglobin A1C Recent Labs    10/17/22 0927  HGBA1C 5.7*   Fasting Lipid Panel Recent Labs    10/18/22 0614  CHOL 132  HDL 28*  LDLCALC 85  TRIG 95  CHOLHDL 4.7    Thyroid Function Tests No results for input(s): "TSH", "T4TOTAL", "T3FREE", "THYROIDAB" in the last 72 hours.  Invalid input(s): "FREET3" _____________  ECHOCARDIOGRAM COMPLETE  Result Date: 10/17/2022    ECHOCARDIOGRAM REPORT   Patient Name:   Fred [name] Date of Exam: 10/17/2022 Medical Rec #:  [id number]      Height:       69.0 in Accession #:    [id number]     Weight:       209.4 lb Date of Birth:  [birth year]     BSA:          2.107 m Patient Age:    27 years       BP:           145/85 mmHg Patient Gender: M              HR:           74 bpm. Exam Location:  Inpatient Procedure: 2D Echo, Cardiac Doppler, Color Doppler and Strain Analysis Indications:    acute ischemic heart disease  History:        Patient has no prior history of Echocardiogram examinations.                 Risk Factors:Sleep Apnea and Hypertension.  Sonographer:    Johny Chess RDCS Referring Phys: [id number] PETER M Martinique IMPRESSIONS  1. Left ventricular ejection fraction, by estimation, is 60 to 65%. The left ventricle has normal function. The left ventricle has no regional wall motion abnormalities. Left ventricular diastolic parameters are indeterminate.  2. Right ventricular systolic function is normal. The right ventricular size is normal. Tricuspid regurgitation signal is inadequate for assessing PA pressure.  3. Left atrial size was mildly dilated.  4. The mitral valve is normal in structure. No evidence of mitral valve regurgitation. No evidence of mitral stenosis.  5. The aortic valve is normal in structure. Aortic valve regurgitation is not visualized. No aortic stenosis is present.  6. The inferior vena cava is normal in size with greater than 50% respiratory variability, suggesting right atrial pressure of 3 mmHg. FINDINGS  Left Ventricle: Left ventricular ejection fraction, by estimation, is 60 to 65%. The left ventricle has normal function. The left ventricle has  no regional wall motion abnormalities. The left  ventricular internal cavity size was normal in size. There is  no left ventricular hypertrophy. Left ventricular diastolic parameters are indeterminate. Right Ventricle: The right ventricular size is normal. No increase in right ventricular wall thickness. Right ventricular systolic function is normal. Tricuspid regurgitation signal is inadequate for assessing PA pressure. Left Atrium: Left atrial size was mildly dilated. Right Atrium: Right atrial size was normal in size. Pericardium: There is no evidence of pericardial effusion. Mitral Valve: The mitral valve is normal in structure. No evidence of mitral valve regurgitation. No evidence of mitral valve stenosis. Tricuspid Valve: The tricuspid valve is normal in structure. Tricuspid valve regurgitation is not demonstrated. No evidence of tricuspid stenosis. Aortic Valve: The aortic valve is normal in structure. Aortic valve regurgitation is not visualized. No aortic stenosis is present. Pulmonic Valve: The pulmonic valve was normal in structure. Pulmonic valve regurgitation is not visualized. No evidence of pulmonic stenosis. Aorta: The aortic root is normal in size and structure. Venous: The inferior vena cava is normal in size with greater than 50% respiratory variability, suggesting right atrial pressure of 3 mmHg. IAS/Shunts: No atrial level shunt detected by color flow Doppler.  LEFT VENTRICLE PLAX 2D LVIDd:         5.60 cm   Diastology LVIDs:         3.50 cm   LV e' medial:    7.18 cm/s LV PW:         1.00 cm   LV E/e' medial:  12.4 LV IVS:        1.00 cm   LV e' lateral:   11.10 cm/s LVOT diam:     2.10 cm   LV E/e' lateral: 8.0 LV SV:         98 LV SV Index:   47 LVOT Area:     3.46 cm  RIGHT VENTRICLE             IVC RV Basal diam:  3.20 cm     IVC diam: 1.90 cm RV S prime:     18.00 cm/s TAPSE (M-mode): 2.7 cm LEFT ATRIUM             Index        RIGHT ATRIUM           Index LA diam:        4.80 cm 2.28 cm/m   RA Area:     15.10 cm LA Vol (A2C):   90.2  ml 42.81 ml/m  RA Volume:   36.20 ml  17.18 ml/m LA Vol (A4C):   67.7 ml 32.13 ml/m LA Biplane Vol: 77.8 ml 36.93 ml/m  AORTIC VALVE LVOT Vmax:   142.00 cm/s LVOT Vmean:  92.200 cm/s LVOT VTI:    0.284 m  AORTA Ao Root diam: 2.90 cm Ao Asc diam:  2.80 cm MITRAL VALVE MV Area (PHT): 3.53 cm    SHUNTS MV Decel Time: 215 msec    Systemic VTI:  0.28 m MV E velocity: 89.20 cm/s  Systemic Diam: 2.10 cm MV A velocity: 96.50 cm/s MV E/A ratio:  0.92 Kardie Tobb DO Electronically signed by Berniece Salines DO Signature Date/Time: 10/17/2022/7:07:10 PM    Final    CARDIAC CATHETERIZATION  Result Date: 10/17/2022   Mid LAD-1 lesion is 25% stenosed.   Mid LAD-2 lesion is 100% stenosed.   A drug-eluting stent was successfully placed using a SYNERGY XD 2.50X20.   Post intervention, there  is a 0% residual stenosis.   There is mild to moderate left ventricular systolic dysfunction.   LV end diastolic pressure is moderately elevated.   The left ventricular ejection fraction is 45-50% by visual estimate. 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 Risk factor modification. If no complications may be a candidate for fast track DC   Disposition   Pt is being discharged home today in good condition.  Follow-up Plans & Appointments     Follow-up Information     Lenna Sciara, NP Follow up on 10/31/2022.   Specialties: Cardiology, Family Medicine Why: at 2:45pm for your follow up appt with Dr. Morrison Old' NP Maris Berger information: [address] [place] [address] [phone]                Discharge Instructions     Amb Referral to Cardiac Rehabilitation   Complete by: As directed    Diagnosis:  Coronary Stents STEMI     After initial evaluation and assessments completed: Virtual Based Care may be provided alone or in conjunction with Phase 2 Cardiac  Rehab based on patient barriers.: Yes   Intensive Cardiac Rehabilitation (ICR) [place] location only OR Traditional Cardiac Rehabilitation (TCR) *If criteria for ICR are not met will enroll in TCR [facility] only): Yes   Call MD for:  difficulty breathing, headache or visual disturbances   Complete by: As directed    Call MD for:  persistant dizziness or light-headedness   Complete by: As directed    Call MD for:  redness, tenderness, or signs of infection (pain, swelling, redness, odor or green/yellow discharge around incision site)   Complete by: As directed    Diet - low sodium heart healthy   Complete by: As directed    Discharge instructions   Complete by: As directed    Radial Site Care Refer to this sheet in the next few weeks. These instructions provide you with information on caring for yourself after your procedure. Your caregiver may also give you more specific instructions. Your treatment has been planned according to current medical practices, but problems sometimes occur. Call your caregiver if you have any problems or questions after your procedure. HOME CARE INSTRUCTIONS You may shower the day after the procedure. Remove the bandage (dressing) and gently wash the site with plain soap and water. Gently pat the site dry.  Do not apply powder or lotion to the site.  Do not submerge the affected site in water for 3 to 5 days.  Inspect the site at least twice daily.  Do not flex or bend the affected arm for 24 hours.  No lifting over 5 pounds (2.3 kg) for 5 days after your procedure.  Do not drive home if you are discharged the same day of the procedure. Have someone else drive you.  You may drive 24 hours after the procedure unless otherwise instructed by your caregiver.  What to expect: Any bruising will usually fade within 1 to 2 weeks.  Blood that collects in the tissue (hematoma) may be painful to the touch. It should usually decrease in size and tenderness within 1 to 2 weeks.  SEEK  IMMEDIATE MEDICAL CARE IF: You have unusual pain at the radial site.  You have redness, warmth, swelling, or pain at the radial site.  You have drainage (other than  a small amount of blood on the dressing).  You have chills.  You have a fever or persistent symptoms for more than 72 hours.  You have a fever and your symptoms suddenly get worse.  Your arm becomes pale, cool, tingly, or numb.  You have heavy bleeding from the site. Hold pressure on the site.   PLEASE DO NOT MISS ANY DOSES OF YOUR BRILINTA!!!!! Also keep a log of you blood pressures and bring back to your follow up appt. Please call the office with any questions.   Patients taking blood thinners should generally stay away from medicines like ibuprofen, Advil, Motrin, naproxen, and Aleve due to risk of stomach bleeding. You may take Tylenol as directed or talk to your primary doctor about alternatives.  PLEASE ENSURE THAT YOU DO NOT RUN OUT OF YOUR BRILINTA. This medication is very important to remain on for at least one year. IF you have issues obtaining this medication due to cost please CALL the office 3-5 business days prior to running out in order to prevent missing doses of this medication.   Increase activity slowly   Complete by: As directed         Discharge Medications   Allergies as of 10/18/2022   No Known Allergies      Medication List     STOP taking these medications    amLODipine 10 MG tablet Commonly known as: NORVASC   naproxen sodium 220 MG tablet Commonly known as: ALEVE       TAKE these medications    aspirin 81 MG tablet Take 81 mg by mouth daily.   CINNAMON PO Take 1,000 mg by mouth daily.   fish oil-omega-3 fatty acids 1000 MG capsule Take 1 g 2 (two) times daily after a meal by mouth.   metoprolol succinate 25 MG 24 hr tablet Commonly known as: TOPROL-XL Take 0.5 tablets (12.5 mg total) by mouth daily. Start taking on: October 19, 2022   multivitamin with minerals  tablet Take 1 tablet by mouth daily.   nitroGLYCERIN 0.4 MG SL tablet Commonly known as: NITROSTAT Place 1 tablet (0.4 mg total) under the tongue every 5 (five) minutes x 3 doses as needed for chest pain.   olmesartan 40 MG tablet Commonly known as: BENICAR Take 40 mg by mouth daily.   omeprazole 20 MG capsule Commonly known as: PRILOSEC Take 20 mg by mouth every other day.   Potassium 99 MG Tabs Take 99 mg by mouth daily.   rosuvastatin 40 MG tablet Commonly known as: CRESTOR Take 1 tablet (40 mg total) by mouth daily. Start taking on: October 19, 2022   ticagrelor 90 MG Tabs tablet Commonly known as: BRILINTA Take 1 tablet (90 mg total) by mouth 2 (two) times daily.        Outstanding Labs/Studies   FLP/LFTs in 8 weeks   Duration of Discharge Encounter   Greater than 30 minutes including physician time.  Signed, Reino Bellis, NP 10/18/2022, 12:45 PM   I have examined the patient and reviewed assessment and plan and discussed with patient.  Agree with above as stated.    CAD/anterior STEMI: Anterolateral MI status post LAD stent. Echo showed normal LVEF.  No CHF sx.  He wants to go home. Stressed importance of DAPT.    Hypertension:  Adding losartan with recent MI.     Obesity: Whole food, plant-based diet.   Plan for discharge if he does well with cardiac rehab.   Larae Grooms

## 2022-10-18 NOTE — Progress Notes (Signed)
CARDIAC REHAB PHASE I     Pt sitting in chair, feeling well today. Pt reports ambulating once last night and took 2 laps around ICU this morning. Reports tolerated well with no CP, SOB or dizziness. Post MI/ stent education including site care, restrictions, risk factors, antiplatelet therapy importance, MI booklet, exercise guidelines, heart healthy diet and CRP2 reviewed with pt and wife. All questions and concerns addressed. Will refer to AP for CRP2. Will continue to follow.    1100-1130    Vanessa Barbara, RN BSN 10/18/2022 11:19 AM

## 2022-10-18 NOTE — Progress Notes (Signed)
Rounding Note    Patient Name: Fred [name] Date of Encounter: 10/18/2022  [place] Cardiologist: None Dr. Martinique  Subjective   No chest pain  Inpatient Medications    Scheduled Meds:  aspirin EC  81 mg Oral Daily   Chlorhexidine Gluconate Cloth  6 each Topical Daily   metoprolol succinate  12.5 mg Oral Daily   rosuvastatin  40 mg Oral Daily   sodium chloride flush  3 mL Intravenous Q12H   ticagrelor  90 mg Oral BID   Continuous Infusions:  sodium chloride     PRN Meds: sodium chloride, acetaminophen, hydrALAZINE, nitroGLYCERIN, ondansetron (ZOFRAN) IV, sodium chloride flush   Vital Signs    Vitals:   10/18/22 0635 10/18/22 0700 10/18/22 0720 10/18/22 0804  BP: (!) 168/87 (!) 169/82 (!) 147/82   Pulse:  95 93   Resp:  17 (!) 21   Temp:    98.4 F (36.9 C)  TempSrc:    Oral  SpO2:  96% 96%   Weight:      Height:        Intake/Output Summary (Last 24 hours) at 10/18/2022 0836 Last data filed at 10/18/2022 0720 Gross per 24 hour  Intake 610 ml  Output 1025 ml  Net -415 ml      10/17/2022    9:11 AM 02/22/2021    7:24 AM 02/08/2021    7:59 AM  Last 3 Weights  Weight (lbs) 209 lb 7 oz 225 lb 3.2 oz 225 lb 3.2 oz  Weight (kg) 95 kg 102.15 kg 102.15 kg      Telemetry    NSR - Personally Reviewed  ECG    NSR, poor R wave progression - Personally Reviewed  Physical Exam   GEN: No acute distress.   Neck: No JVD Cardiac: RRR, no murmurs, rubs, or gallops.  Respiratory: Clear to auscultation bilaterally. GI: Soft, nontender, non-distended  MS: No edema; No deformity. Right radial site is stable Neuro:  Nonfocal  Psych: Normal affect   Labs    High Sensitivity Troponin:   Recent Labs  Lab 10/17/22 0927 10/17/22 1135 10/17/22 1410  TROPONINIHS 21* 1,117* 4,208*     Chemistry Recent Labs  Lab 10/17/22 0927 10/17/22 0929 10/18/22 0614  NA 133* 138 142  K 2.8* 2.9* 3.2*  CL 103 100 110  CO2 23  --  24  GLUCOSE 159* 148*  112*  BUN 7* 10 10  CREATININE 0.84 0.70 0.92  CALCIUM 7.5*  --  8.4*  MG 1.8  --   --   PROT 5.9*  --   --   ALBUMIN 3.2*  --   --   AST 21  --   --   ALT 21  --   --   ALKPHOS 64  --   --   BILITOT 1.2  --   --   GFRNONAA >60  --  >60  ANIONGAP 7  --  8    Lipids  Recent Labs  Lab 10/17/22 0927  CHOL 124  TRIG 91  HDL 26*  LDLCALC 80  CHOLHDL 4.8    Hematology Recent Labs  Lab 10/17/22 0927 10/17/22 0929 10/18/22 0614  WBC 7.2  --  10.2  RBC 4.35  --  4.48  HGB 13.5 13.3 13.9  HCT 38.7* 39.0 40.8  MCV 89.0  --  91.1  MCH 31.0  --  31.0  MCHC 34.9  --  34.1  RDW 13.2  --  13.6  PLT 201  --  213   Thyroid No results for input(s): "TSH", "FREET4" in the last 168 hours.  BNPNo results for input(s): "BNP", "PROBNP" in the last 168 hours.  DDimer No results for input(s): "DDIMER" in the last 168 hours.   Radiology    ECHOCARDIOGRAM COMPLETE  Result Date: 10/17/2022    ECHOCARDIOGRAM REPORT   Patient Name:   Fred [name] Date of Exam: 10/17/2022 Medical Rec #:  [id number]      Height:       69.0 in Accession #:    [id number]     Weight:       209.4 lb Date of Birth:  [birth year]     BSA:          2.107 m Patient Age:    72 years       BP:           145/85 mmHg Patient Gender: M              HR:           74 bpm. Exam Location:  Inpatient Procedure: 2D Echo, Cardiac Doppler, Color Doppler and Strain Analysis Indications:    acute ischemic heart disease  History:        Patient has no prior history of Echocardiogram examinations.                 Risk Factors:Sleep Apnea and Hypertension.  Sonographer:    Johny Chess RDCS Referring Phys: [id number] PETER M Martinique IMPRESSIONS  1. Left ventricular ejection fraction, by estimation, is 60 to 65%. The left ventricle has normal function. The left ventricle has no regional wall motion abnormalities. Left ventricular diastolic parameters are indeterminate.  2. Right ventricular systolic function is normal. The right ventricular size  is normal. Tricuspid regurgitation signal is inadequate for assessing PA pressure.  3. Left atrial size was mildly dilated.  4. The mitral valve is normal in structure. No evidence of mitral valve regurgitation. No evidence of mitral stenosis.  5. The aortic valve is normal in structure. Aortic valve regurgitation is not visualized. No aortic stenosis is present.  6. The inferior vena cava is normal in size with greater than 50% respiratory variability, suggesting right atrial pressure of 3 mmHg. FINDINGS  Left Ventricle: Left ventricular ejection fraction, by estimation, is 60 to 65%. The left ventricle has normal function. The left ventricle has no regional wall motion abnormalities. The left ventricular internal cavity size was normal in size. There is  no left ventricular hypertrophy. Left ventricular diastolic parameters are indeterminate. Right Ventricle: The right ventricular size is normal. No increase in right ventricular wall thickness. Right ventricular systolic function is normal. Tricuspid regurgitation signal is inadequate for assessing PA pressure. Left Atrium: Left atrial size was mildly dilated. Right Atrium: Right atrial size was normal in size. Pericardium: There is no evidence of pericardial effusion. Mitral Valve: The mitral valve is normal in structure. No evidence of mitral valve regurgitation. No evidence of mitral valve stenosis. Tricuspid Valve: The tricuspid valve is normal in structure. Tricuspid valve regurgitation is not demonstrated. No evidence of tricuspid stenosis. Aortic Valve: The aortic valve is normal in structure. Aortic valve regurgitation is not visualized. No aortic stenosis is present. Pulmonic Valve: The pulmonic valve was normal in structure. Pulmonic valve regurgitation is not visualized. No evidence of pulmonic stenosis. Aorta: The aortic root is normal in size and structure. Venous: The inferior vena cava is normal in size with  greater than 50% respiratory variability,  suggesting right atrial pressure of 3 mmHg. IAS/Shunts: No atrial level shunt detected by color flow Doppler.  LEFT VENTRICLE PLAX 2D LVIDd:         5.60 cm   Diastology LVIDs:         3.50 cm   LV e' medial:    7.18 cm/s LV PW:         1.00 cm   LV E/e' medial:  12.4 LV IVS:        1.00 cm   LV e' lateral:   11.10 cm/s LVOT diam:     2.10 cm   LV E/e' lateral: 8.0 LV SV:         98 LV SV Index:   47 LVOT Area:     3.46 cm  RIGHT VENTRICLE             IVC RV Basal diam:  3.20 cm     IVC diam: 1.90 cm RV S prime:     18.00 cm/s TAPSE (M-mode): 2.7 cm LEFT ATRIUM             Index        RIGHT ATRIUM           Index LA diam:        4.80 cm 2.28 cm/m   RA Area:     15.10 cm LA Vol (A2C):   90.2 ml 42.81 ml/m  RA Volume:   36.20 ml  17.18 ml/m LA Vol (A4C):   67.7 ml 32.13 ml/m LA Biplane Vol: 77.8 ml 36.93 ml/m  AORTIC VALVE LVOT Vmax:   142.00 cm/s LVOT Vmean:  92.200 cm/s LVOT VTI:    0.284 m  AORTA Ao Root diam: 2.90 cm Ao Asc diam:  2.80 cm MITRAL VALVE MV Area (PHT): 3.53 cm    SHUNTS MV Decel Time: 215 msec    Systemic VTI:  0.28 m MV E velocity: 89.20 cm/s  Systemic Diam: 2.10 cm MV A velocity: 96.50 cm/s MV E/A ratio:  0.92 Kardie Tobb DO Electronically signed by Berniece Salines DO Signature Date/Time: 10/17/2022/7:07:10 PM    Final    CARDIAC CATHETERIZATION  Result Date: 10/17/2022   Mid LAD-1 lesion is 25% stenosed.   Mid LAD-2 lesion is 100% stenosed.   A drug-eluting stent was successfully placed using a SYNERGY XD 2.50X20.   Post intervention, there is a 0% residual stenosis.   There is mild to moderate left ventricular systolic dysfunction.   LV end diastolic pressure is moderately elevated.   The left ventricular ejection fraction is 45-50% by visual estimate. 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 Risk factor modification. If no complications may be a  candidate for fast track DC    Cardiac Studies   Normal sinus rhythm, poor R wave progression  Patient Profile     72 y.o. male with several risk factors for CAD who had an anterior MI.  Assessment & Plan    CAD/anterior STEMI: Anterolateral MI status post LAD stent. Echo showed normal LVEF.  No CHF sx.  He wants to go home.   Hypertension:  Adding losartan with recent MI.    Obesity: Whole food, plant-based diet.  Plan for discharge if he does well with cardiac rehab.   For questions or updates, please contact [facility] Please consult [email or web address] for contact info under        Signed, Larae Grooms, MD  10/18/2022, 8:36 AM

## 2022-10-20 LAB — LIPOPROTEIN A (LPA): Lipoprotein (a): 49.4 nmol/L — ABNORMAL HIGH (ref <75.0)

## 2022-10-29 ENCOUNTER — Encounter: Payer: Self-pay | Admitting: Nurse Practitioner

## 2022-10-29 ENCOUNTER — Ambulatory Visit: Payer: PPO | Attending: Nurse Practitioner | Admitting: Nurse Practitioner

## 2022-10-29 VITALS — BP 162/92 | HR 66 | Ht 68.0 in | Wt 228.8 lb

## 2022-10-29 DIAGNOSIS — G4733 Obstructive sleep apnea (adult) (pediatric): Secondary | ICD-10-CM | POA: Diagnosis not present

## 2022-10-29 DIAGNOSIS — R7303 Prediabetes: Secondary | ICD-10-CM | POA: Diagnosis not present

## 2022-10-29 DIAGNOSIS — I10 Essential (primary) hypertension: Secondary | ICD-10-CM | POA: Diagnosis not present

## 2022-10-29 DIAGNOSIS — I2510 Atherosclerotic heart disease of native coronary artery without angina pectoris: Secondary | ICD-10-CM

## 2022-10-29 DIAGNOSIS — E785 Hyperlipidemia, unspecified: Secondary | ICD-10-CM | POA: Diagnosis not present

## 2022-10-29 MED ORDER — TICAGRELOR 90 MG PO TABS: 90.0000 mg | ORAL_TABLET | Freq: Two times a day (BID) | ORAL | 2 refills | Status: DC

## 2022-10-29 MED ORDER — CARVEDILOL 6.25 MG PO TABS: 6.2500 mg | ORAL_TABLET | Freq: Two times a day (BID) | ORAL | 3 refills | Status: DC

## 2022-10-29 MED ORDER — OLMESARTAN MEDOXOMIL 40 MG PO TABS: 40.0000 mg | ORAL_TABLET | Freq: Every day | ORAL | 3 refills | Status: DC

## 2022-10-29 MED ORDER — ROSUVASTATIN CALCIUM 40 MG PO TABS: 40.0000 mg | ORAL_TABLET | Freq: Every day | ORAL | 1 refills | Status: DC

## 2022-10-29 NOTE — Patient Instructions (Addendum)
Medication Instructions:  Stop taking Metoroprolol Succinate. Start taking Carvedilol 6.25 MG by mouth twice daily.  *If you need a refill on your cardiac medications before your next appointment, please call your pharmacy*   Lab Work: None ordered   Testing/Procedures: None ordered   Follow-Up: At [facility], you and your health needs are our priority.  As part of our continuing mission to provide you with exceptional heart care, we have created designated Provider Care Teams.  These Care Teams include your primary Cardiologist (physician) and Advanced Practice Providers (APPs -  Physician Assistants and Nurse Practitioners) who all work together to provide you with the care you need, when you need it.  We recommend signing up for the patient portal called "MyChart".  Sign up information is provided on this After Visit Summary.  MyChart is used to connect with patients for Virtual Visits (Telemedicine).  Patients are able to view lab/test results, encounter notes, upcoming appointments, etc.  Non-urgent messages can be sent to your provider as well.   To learn more about what you can do with MyChart, go to [email or web address].    Your next appointment:   6 week(s) Please Do not eat after midnight the night before your next appointment so you will be fasting for lab work.  Provider:   Diona Browner, NP        Other Instructions Please monitor your blood pressure daily and report to your provider a consistent reading above 130/80.   HOW TO TAKE YOUR BLOOD PRESSURE: Rest 5 minutes before taking your blood pressure. Don't smoke or drink caffeinated beverages for at least 30 minutes before. Take your blood pressure before (not after) you eat. Sit comfortably with your back supported and both feet on the floor (don't cross your legs). Elevate your arm to heart level on a table or a desk. Use the proper sized cuff. It should fit smoothly and snugly around your bare upper  arm. There should be enough room to slip a fingertip under the cuff. The bottom edge of the cuff should be 1 inch above the crease of the elbow.

## 2022-10-29 NOTE — Progress Notes (Signed)
Office Visit    Patient Name: Fred [name] Date of Encounter: 10/29/2022  Primary Care Provider:  Martinique, Peter M, MD Primary Cardiologist:  Peter Martinique, MD  Chief Complaint    72 year old male with a history of CAD s/p STEMI, DES-mLAD in 09/2022, hypertension, hyperlipidemia, prediabetes, GERD, and OSA who presents for hospital follow-up related to CAD.  Past Medical History    Past Medical History:  Diagnosis Date   Contact lens/glasses fitting    GERD (gastroesophageal reflux disease)    has also had esoph dilated   Hypertension    Sleep apnea    wears a cpap-has for 5 yr-will wear post op   Past Surgical History:  Procedure Laterality Date   COLONOSCOPY     CORONARY/GRAFT ACUTE MI REVASCULARIZATION N/A 10/17/2022   Procedure: Coronary/Graft Acute MI Revascularization;  Surgeon: Martinique, Peter M, MD;  Location: [place] [facility];  Service: Cardiovascular;  Laterality: N/A;   ESOPHAGEAL DILATION  2003   for diverticum in esophageal   INGUINAL HERNIA REPAIR  03/05/2012   Procedure: LAPAROSCOPIC INGUINAL HERNIA;  Surgeon: Harl Bowie, MD;  Location: [place];  Service: General;  Laterality: Right;   LEFT HEART CATH AND CORONARY ANGIOGRAPHY N/A 10/17/2022   Procedure: LEFT HEART CATH AND CORONARY ANGIOGRAPHY;  Surgeon: Martinique, Peter M, MD;  Location: [place] [facility];  Service: Cardiovascular;  Laterality: N/A;   UPPER GASTROINTESTINAL ENDOSCOPY     removal FB from esoph    Allergies  No Known Allergies   Labs/Other Studies Reviewed    The following studies were reviewed today: Cath: 10/17/2022     Mid LAD-1 lesion is 25% stenosed.   Mid LAD-2 lesion is 100% stenosed.   A drug-eluting stent was successfully placed using a SYNERGY XD 2.50X20.   Post intervention, there is a 0% residual stenosis.   There is mild to moderate left ventricular systolic dysfunction.   LV end diastolic pressure is moderately elevated.   The left ventricular  ejection fraction is 45-50% by visual estimate.   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 Risk factor modification. If no complications may be a candidate for fast track DC   Diagnostic Dominance: Right  Intervention    Echo: 10/17/2022   IMPRESSIONS     1. Left ventricular ejection fraction, by estimation, is 60 to 65%. The  left ventricle has normal function. The left ventricle has no regional  wall motion abnormalities. Left ventricular diastolic parameters are  indeterminate.   2. Right ventricular systolic function is normal. The right ventricular  size is normal. Tricuspid regurgitation signal is inadequate for assessing  PA pressure.   3. Left atrial size was mildly dilated.   4. The mitral valve is normal in structure. No evidence of mitral valve  regurgitation. No evidence of mitral stenosis.   5. The aortic valve is normal in structure. Aortic valve regurgitation is  not visualized. No aortic stenosis is present.   6. The inferior vena cava is normal in size with greater than 50%  respiratory variability, suggesting right atrial pressure of 3 mmHg.   Recent Labs: 10/17/2022: ALT 21; Magnesium 1.8 10/18/2022: BUN 10; Creatinine, Ser 0.92; Hemoglobin 13.9; Platelets 213; Potassium 3.2; Sodium 142  Recent Lipid Panel    Component Value Date/Time   CHOL 132 10/18/2022 0614  TRIG 95 10/18/2022 0614   HDL 28 (L) 10/18/2022 0614   CHOLHDL 4.7 10/18/2022 0614   VLDL 19 10/18/2022 0614   LDLCALC 85 10/18/2022 0614    History of Present Illness    72 year old male with the above past medical history including CAD s/p STEMI, DES-mLAD in 09/2022, hypertension, hyperlipidemia, prediabetes, GERD, and OSA.  He presented to the ED on 10/17/2022 with sudden onset of weakness, shortness of breath in the setting of anterolateral STEMI.   He underwent emergent cardiac catheterization which revealed 100% occluded mid LAD treated with PCI/DES x 1.  He was started on DAPT with aspirin and B rilinta.  He was also started on olmesartan and metoprolol.  Echocardiogram showed EF 60 to 65%, normal LV function, no RWMA, normal RV systolic function, no significant valvular abnormalities.  He was discharged home in stable condition on 10/18/2022.  He presents today for follow-up accompanied by his wife.  Since his hospitalization he has done well from a cardiac standpoint.  He denies any symptoms concerning for angina.  He is back to walking regularly for exercise and is gradually increasing his walking distance (prior to his MI he was walking 3.5 miles a day). He notes his BP has been somewhat elevated.  He also has stable nonpitting bilateral lower extremity edema in the setting of amlodipine use.  He denies dyspnea, PND, orthopnea, weight gain.  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 Overall, he reports feeling well.   Home Medications    Current Outpatient Medications  Medication Sig Dispense Refill   aspirin 81 MG tablet Take 81 mg by mouth daily.     carvedilol (COREG) 6.25 MG tablet Take 1 tablet (6.25 mg total) by mouth 2 (two) times daily. 180 tablet 3   fish oil-omega-3 fatty acids 1000 MG capsule Take 1 g 2 (two) times daily after a meal by mouth.      Multiple Vitamins-Minerals (MULTIVITAMIN WITH MINERALS) tablet Take 1 tablet by mouth daily.     nitroGLYCERIN (NITROSTAT) 0.4 MG SL tablet Place 1 tablet (0.4 mg total) under the tongue every 5 (five) minutes x 3 doses as needed for chest pain. 25 tablet 2   omeprazole (PRILOSEC) 20 MG capsule Take 20 mg by mouth every other day.     Potassium 99 MG TABS Take 99 mg by mouth daily.     olmesartan (BENICAR) 40 MG tablet Take 1 tablet (40 mg total) by mouth daily. 90 tablet 3   rosuvastatin (CRESTOR) 40 MG tablet Take 1 tablet (40 mg total)  by mouth daily. 90 tablet 1   ticagrelor (BRILINTA) 90 MG TABS tablet Take 1 tablet (90 mg total) by mouth 2 (two) times daily. 180 tablet 2   No current facility-administered medications for this visit.     Review of Systems    He denies chest pain, palpitations, dyspnea, pnd, orthopnea, n, v, dizziness, syncope, weight gain, or early satiety. All other systems reviewed and are otherwise negative except as noted above.    Cardiac Rehabilitation Eligibility Assessment  The patient is ready to start cardiac rehabilitation from a cardiac standpoint.    Physical Exam    VS:  BP (!) 162/92   Pulse 66   Ht 5\' 8"  (1.727 m)   Wt 228 lb 12.8 oz (103.8 kg)   SpO2 95%   BMI 34.79 kg/m   GEN: Well nourished, well developed, in no acute distress. HEENT: normal.  Neck: Supple, no JVD, carotid bruits, or masses. Cardiac: RRR, no murmurs, rubs, or gallops. No clubbing, cyanosis, nonpitting bilateral lower extremity edema.  Radials/DP/PT 2+ and equal bilaterally.  Right radial cath site without bruising, bleeding, or hematoma. Respiratory:  Respirations regular and unlabored, clear to auscultation bilaterally. GI: Soft, nontender, nondistended, BS + x 4. MS: no deformity or atrophy. Skin: warm and dry, no rash. Neuro:  Strength and sensation are intact. Psych: Normal affect.  Accessory Clinical Findings    ECG personally reviewed by me today -NSR, 66 bpm- no acute changes.   Lab Results  Component Value Date   WBC 10.2 10/18/2022   HGB 13.9 10/18/2022   HCT 40.8 10/18/2022   MCV 91.1 10/18/2022   PLT 213 10/18/2022   Lab Results  Component Value Date   CREATININE 0.92 10/18/2022   BUN 10 10/18/2022   NA 142 10/18/2022   K 3.2 (L) 10/18/2022   CL 110 10/18/2022   CO2 24 10/18/2022   Lab Results  Component Value Date   ALT 21 10/17/2022   AST 21 10/17/2022   ALKPHOS 64 10/17/2022   BILITOT 1.2 10/17/2022   Lab Results  Component Value Date   CHOL 132 10/18/2022   HDL 28  (L) 10/18/2022   LDLCALC 85 10/18/2022   TRIG 95 10/18/2022   CHOLHDL 4.7 10/18/2022    Lab Results  Component Value Date   HGBA1C 5.7 (H) 10/17/2022    Assessment & Plan    1. CAD: S/p STEMI, DES-mLAD in 09/2022. Stable with no anginal symptoms.  Continue aspirin, Brilinta, carvedilol as below,  olmesartan, and Crestor. He has a form that needs to be completed by Dr. Martinique for insurance purposes.  I have given this to Dr. Doug Sou nurse for completion. I think it would be reasonable for him to return to work by 11/11/2022.   2. Hypertension: BP is elevated.  Will stop metoprolol and start carvedilol 6.25 twice daily.  Continue to monitor BP and report SBP consistently greater than 130/80.  He has been taking amlodipine 10 mg daily even though this was listed as discontinued on his hospital discharge paperwork.  Will continue amlodipine, olmesartan.   3. Hyperlipidemia: LDL was 85 in 09/2022.  Recently started on Crestor. Will plan for fasting lipid panel, LFTs in 6 weeks.  4. Prediabetes: A1c was 5.7 in 09/2022.  Monitored and managed per PCP.  5. OSA: Adherent to CPAP.   6. Disposition: Follow-up in 6 weeks.     HYPERTENSION CONTROL Vitals:   10/29/22 1444 10/29/22 1501  BP: (!) 176/80 (!) 162/92    The patient's blood pressure is elevated above target today.  In order to address the patient's elevated BP: Blood pressure will be monitored at home to determine if medication changes need to be made.; A new medication was prescribed today.; Follow up with general cardiology has been recommended.      Lenna Sciara, NP 10/29/2022, 6:11 PM

## 2022-11-04 ENCOUNTER — Telehealth: Payer: Self-pay | Admitting: Cardiology

## 2022-11-04 NOTE — Telephone Encounter (Signed)
Patient gave form to Irving Burton who was to give to Dr Swaziland for signature.  Form is for insurance and return to work.  States one sheet waiting for Dr Swaziland tosign.  He had hope to come get it this morning but advised to wait until we call that it is completed.  Also stated we could upload to my chart once complete and he could get the form this way.  Waiting to hear if form is ready.

## 2022-11-04 NOTE — Telephone Encounter (Signed)
Notified patient that form will be signed tomorrow and we will let him know once completed

## 2022-11-04 NOTE — Telephone Encounter (Signed)
Patient is following-up on paperwork he dropped off for Dr. Elvis Coil signature.

## 2022-11-05 ENCOUNTER — Telehealth: Payer: Self-pay | Admitting: Cardiology

## 2022-11-05 NOTE — Telephone Encounter (Signed)
Noted. Spoke with [facility] LPN. Paper work was completed by Elnita Maxwell LPN and signed by Dr. Swaziland. Pt received paperwork to day.

## 2022-11-05 NOTE — Telephone Encounter (Signed)
Paper Work Dropped Off:  Insurance Paper Work  Date: 11/05/2022  Location of paper:  patient gave paper work to [place] @ appointment on 4/2

## 2022-11-05 NOTE — Telephone Encounter (Signed)
Dr.Jordan signed insurance form to return to work.Form was given to patient.

## 2022-12-09 ENCOUNTER — Ambulatory Visit: Payer: PPO | Admitting: Nurse Practitioner

## 2022-12-24 ENCOUNTER — Telehealth: Payer: Self-pay | Admitting: Cardiology

## 2022-12-24 NOTE — Telephone Encounter (Signed)
   Pre-operative Risk Assessment    Patient Name: Fred [name]  DOB: April 30, 1951 MRN: [id number]      Request for Surgical Clearance    Procedure:   Routine cleaning  Date of Surgery:  Clearance TBD                                 Surgeon:  Dr. Catalina Lunger Surgeon's Group or Practice Name:   Phone number:  [phone]  Fax number:  [phone]   Type of Clearance Requested:   - Medical    Type of Anesthesia:   None   Additional requests/questions:   Caller stated they need to know if the patient will need to be pre-medicated prior to cleaning and will need a note sent stating patient is OK to have teeth cleaned.  Signed, Annetta Maw   12/24/2022, 10:16 AM

## 2022-12-24 NOTE — Telephone Encounter (Signed)
   Patient Name: Fred [name]  DOB: 11-Jan-1951 MRN: [id number]  Primary Cardiologist: Peter Swaziland, MD  Chart reviewed as part of pre-operative protocol coverage.   Simple dental extractions (i.e. 1-2 teeth) are considered low risk procedures per guidelines and generally do not require any specific cardiac clearance. It is also generally accepted that for simple extractions and dental cleanings, there is no need to interrupt blood thinner therapy.   SBE prophylaxis is not required for the patient from a cardiac standpoint.  I will route this recommendation to the requesting party via Epic fax function and remove from pre-op pool.  Please call with questions.  Napoleon Form, Leodis Rains, NP 12/24/2022, 10:30 AM

## 2022-12-26 ENCOUNTER — Telehealth: Payer: Self-pay | Admitting: Cardiology

## 2022-12-26 NOTE — Telephone Encounter (Signed)
Spoke with Felica and confirmed we received the release form signed by patient.

## 2022-12-26 NOTE — Telephone Encounter (Signed)
[employer] insurance is calling to verify that a request that was received on 05/23 in regards to this patient.

## 2023-01-01 ENCOUNTER — Telehealth: Payer: Self-pay

## 2023-01-01 NOTE — Telephone Encounter (Signed)
Received a letter from [employer] requesting a letter from Dr.Jordan wanting to know his opinion if patient's recent heart attack was caused by patient responding to 2 fire calls prior.Letter sent to fax # [phone].

## 2023-01-16 NOTE — Progress Notes (Signed)
Cardiology Clinic Note   Date: 01/17/2023 ID: Hamilton Marinello, DOB [birth year], MRN [id number]  Primary Cardiologist:  Peter Swaziland, MD  Patient Profile    Fred [name] is a 72 y.o. male who presents to the clinic today for BP follow up.     Past medical history significant for: CAD.  LHC 10/17/2022 (STEMI): Mid LAD #1 25%, #2 100%.  Moderately elevated LVEDP.  EF 45 to 50%.  PCI with DES to mid LAD. Echo 10/17/2022: EF 60 to 65%.  Indeterminate diastolic parameters.  Normal RV function.  Mild LAE. Hyperlipidemia.  Lipid panel 10/18/2022: LDL 85, HDL 28, TG 95, total 132. Prediabetes.  GERD. OSA.  CPAP      History of Present Illness    Amanda Pote was first evaluated by cardiology on 10/17/2022 during hospital admission for STEMI.  Patient presented to the ED with sudden onset of weakness, shortness of breath and was found to have anterolateral STEMI.  He was taken emergently to the Cath Lab and underwent PCI with DES to mid LAD.  Patient was seen in follow-up on 10/29/2022 by Bernadene Person, NP.  He denied anginal symptoms and was gradually increasing his walking distance.  He complained of slightly elevated BP and stable nonpitting lower extremity edema.  It was decided patient could return to work 11/11/2022.  Metoprolol was stopped in favor of carvedilol 6.25 mg twice daily.  Today, patient continues to have elevated BP. He has not checked it in a while but when he was checking it, it was consistently running 140-150/80-90. He denies headaches, dizziness, or vision changes.Patient denies shortness of breath or dyspnea on exertion. No chest pain, pressure, or tightness. Denies lower extremity edema, orthopnea, or PND. No palpitations. 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 He inquires about decreasing his dose of potassium. Will get labs today before deciding.     ROS: All other systems reviewed and are otherwise negative  except as noted in History of Present Illness.  Studies Reviewed       EKG is not ordered today.   Risk Assessment/Calculations      HYPERTENSION CONTROL Vitals:   01/17/23 0755 01/17/23 0905  BP: (!) 144/94 (!) 150/80    The patient's blood pressure is elevated above target today.  In order to address the patient's elevated BP: Blood pressure will be monitored at home to determine if medication changes need to be made. (Waiting to hear from pt regarding medications)           Physical Exam    VS:  BP (!) 144/94   Pulse (!) 55   Ht 5\' 9"  (1.753 m)   Wt 224 lb (101.6 kg)   SpO2 95%   BMI 33.08 kg/m  , BMI Body mass index is 33.08 kg/m.  GEN: Well nourished, well developed, in no acute distress. Neck: No JVD or carotid bruits. Cardiac:  RRR. No murmurs. No rubs or gallops.   Respiratory:  Respirations regular and unlabored. Clear to auscultation without rales, wheezing or rhonchi. GI: Soft, nontender, nondistended. Extremities: Radials/DP/PT 2+ and equal bilaterally. No clubbing or cyanosis. No edema.  Skin: Warm and dry, no rash. Neuro: Strength intact.  Assessment & Plan    CAD.  S/p PCI with DES to mid LAD March 2024. Patient denies chest pain, tightness, or pressure. 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 Continue aspirin, Brilinta, carvedilol, as needed SL NTG. Hypertension: BP today 144/94 on intake and 150/80 on recheck. Patient denies headaches, dizziness or vision changes. Patient has not checked BP at home in a while but was getting readings of 140-150/80-90. Upon further conversation with patient he is unsure if he is taking amlodipine. Per Emily's note from previous visit he was to continue it. He is going to contact me via MyChart to let me know if he is taking it. If he is not he is instructed to restart it. If he is taking it will recommend starting hydrochlorothiazide 25 mg. Continue  carvedilol and olmesartan.   Hyperlipidemia.  LDL March 2024 85, not at goal.  Continue rosuvastatin.  Will get lipid panel and LFTs today.  Disposition: Lipid panel and CMP today.  Will contact the office to let me know if he is taking amlodipine. If he is, recommend starting hydrochlorothiazide and repeating BMP in 2 weeks. Return in 3 months or sooner as needed.          Signed, Etta Grandchild. Macrina Lehnert, DNP, NP-C

## 2023-01-17 ENCOUNTER — Ambulatory Visit: Payer: PPO | Attending: Nurse Practitioner | Admitting: Student

## 2023-01-17 ENCOUNTER — Encounter: Payer: Self-pay | Admitting: Student

## 2023-01-17 VITALS — BP 150/80 | HR 55 | Ht 69.0 in | Wt 224.0 lb

## 2023-01-17 DIAGNOSIS — E785 Hyperlipidemia, unspecified: Secondary | ICD-10-CM

## 2023-01-17 DIAGNOSIS — I2510 Atherosclerotic heart disease of native coronary artery without angina pectoris: Secondary | ICD-10-CM

## 2023-01-17 DIAGNOSIS — I10 Essential (primary) hypertension: Secondary | ICD-10-CM

## 2023-01-17 DIAGNOSIS — Z79899 Other long term (current) drug therapy: Secondary | ICD-10-CM

## 2023-01-17 LAB — LIPID PANEL
Chol/HDL Ratio: 2.3 ratio (ref 0.0–5.0)
Cholesterol, Total: 82 mg/dL — ABNORMAL LOW (ref 100–199)
HDL: 36 mg/dL — ABNORMAL LOW (ref >39)
LDL Chol Calc (NIH): 28 mg/dL (ref 0–99)
Triglycerides: 94 mg/dL (ref 0–149)
VLDL Cholesterol Cal: 18 mg/dL (ref 5–40)

## 2023-01-17 LAB — COMPREHENSIVE METABOLIC PANEL
ALT: 36 IU/L (ref 0–44)
AST: 30 IU/L (ref 0–40)
Albumin: 4.7 g/dL (ref 3.9–4.9)
Alkaline Phosphatase: 104 IU/L (ref 44–121)
BUN/Creatinine Ratio: 11 (ref 10–24)
BUN: 10 mg/dL (ref 8–27)
Bilirubin Total: 0.9 mg/dL (ref 0.0–1.2)
CO2: 27 mmol/L (ref 20–29)
Calcium: 9.4 mg/dL (ref 8.6–10.2)
Chloride: 107 mmol/L — ABNORMAL HIGH (ref 96–106)
Creatinine, Ser: 0.91 mg/dL (ref 0.76–1.27)
Globulin, Total: 3 g/dL (ref 1.5–4.5)
Glucose: 112 mg/dL — ABNORMAL HIGH (ref 70–99)
Potassium: 3.9 mmol/L (ref 3.5–5.2)
Sodium: 145 mmol/L — ABNORMAL HIGH (ref 134–144)
Total Protein: 7.7 g/dL (ref 6.0–8.5)
eGFR: 91 mL/min/{1.73_m2} (ref >59)

## 2023-01-17 MED ORDER — HYDROCHLOROTHIAZIDE 25 MG PO TABS: 25.0000 mg | ORAL_TABLET | Freq: Every day | ORAL | 3 refills | Status: DC

## 2023-01-17 NOTE — Telephone Encounter (Signed)
Will you please contact patient and let him know I would like to start him on Hydrochlorothiazide 25 mg daily for better BP control. He will need to come in for repeat BMP in 2weeks.  Thank you!  DW

## 2023-01-17 NOTE — Patient Instructions (Signed)
Medication Instructions:  NO CHANGES *If you need a refill on your cardiac medications before your next appointment, please call your pharmacy*   Lab Work: DRAWN TODAY If you have labs (blood work) drawn today and your tests are completely normal, you will receive your results only by: MyChart Message (if you have MyChart) OR A paper copy in the mail If you have any lab test that is abnormal or we need to change your treatment, we will call you to review the results.   Testing/Procedures: NONE   Follow-Up: At [facility], you and your health needs are our priority.  As part of our continuing mission to provide you with exceptional heart care, we have created designated Provider Care Teams.  These Care Teams include your primary Cardiologist (physician) and Advanced Practice Providers (APPs -  Physician Assistants and Nurse Practitioners) who all work together to provide you with the care you need, when you need it.  Your next appointment:   3 month(s)  Provider:   Peter Swaziland, MD or APP    Other Instructions Contact office if blood pressure is greater than 130/80

## 2023-01-17 NOTE — Telephone Encounter (Signed)
Called patient and let him know that Fred Levering, NP wanted him to start hydrochlorothiazide 25 mg by mouth daily. Patient will get lab work done at [facility] in 2 weeks.  Patient verbalized understanding and had no other questions.

## 2023-02-04 DIAGNOSIS — I10 Essential (primary) hypertension: Secondary | ICD-10-CM | POA: Diagnosis not present

## 2023-02-04 LAB — BASIC METABOLIC PANEL: Sodium: 143 mmol/L (ref 134–144)

## 2023-02-05 LAB — BASIC METABOLIC PANEL
Calcium: 9.5 mg/dL (ref 8.6–10.2)
Chloride: 101 mmol/L (ref 96–106)
Glucose: 110 mg/dL — ABNORMAL HIGH (ref 70–99)
eGFR: 85 mL/min/{1.73_m2} (ref >59)

## 2023-02-05 MED ORDER — POTASSIUM CHLORIDE ER 10 MEQ PO TBCR: 10.0000 meq | EXTENDED_RELEASE_TABLET | Freq: Every day | ORAL | 3 refills | Status: DC

## 2023-02-05 NOTE — Addendum Note (Signed)
Addended by: Shon Hale on: 02/05/2023 10:36 AM   Modules accepted: Orders

## 2023-03-26 DIAGNOSIS — D2271 Melanocytic nevi of right lower limb, including hip: Secondary | ICD-10-CM | POA: Diagnosis not present

## 2023-03-26 DIAGNOSIS — L814 Other melanin hyperpigmentation: Secondary | ICD-10-CM | POA: Diagnosis not present

## 2023-03-26 DIAGNOSIS — D2372 Other benign neoplasm of skin of left lower limb, including hip: Secondary | ICD-10-CM | POA: Diagnosis not present

## 2023-03-26 DIAGNOSIS — D225 Melanocytic nevi of trunk: Secondary | ICD-10-CM | POA: Diagnosis not present

## 2023-03-26 DIAGNOSIS — L821 Other seborrheic keratosis: Secondary | ICD-10-CM | POA: Diagnosis not present

## 2023-03-26 DIAGNOSIS — D2272 Melanocytic nevi of left lower limb, including hip: Secondary | ICD-10-CM | POA: Diagnosis not present

## 2023-04-24 ENCOUNTER — Ambulatory Visit: Payer: PPO | Admitting: Student

## 2023-04-26 NOTE — Progress Notes (Unsigned)
   Cardiology Clinic Note   Date: 04/26/2023 ID: Fred [name], DOB 08-31-1950, MRN [id number]  Primary Cardiologist:  Peter Swaziland, MD  Patient Profile    Fred [name] is a 72 y.o. male who presents to the clinic today for ***    Past medical history significant for: CAD.  LHC 10/17/2022 (STEMI): Mid LAD #1 25%, #2 100%.  Moderately elevated LVEDP.  EF 45 to 50%.  PCI with DES to mid LAD. Echo 10/17/2022: EF 60 to 65%.  Indeterminate diastolic parameters.  Normal RV function.  Mild LAE. Hyperlipidemia.  Lipid panel 01/17/2023: LDL 28, HDL 36, TG 94, total 82. Prediabetes.  GERD. OSA.  CPAP       History of Present Illness    Fred [name] was first evaluated by cardiology on 10/17/2022 during hospital admission for STEMI. Patient presented to the ED with sudden onset of weakness, shortness of breath and was found to have anterolateral STEMI. He was taken emergently to the Cath Lab and underwent PCI with DES to mid LAD.   Patient was last seen in the office by me on 01/17/2023 for BP follow-up.  Patient reported when he was checking his BP it was running 140-150/80-90.  BP at the time of his visit was 144/94 on intake and 150/80 on recheck.  There was some confusion as to which medications patient was taking.  He did not think he was taking amlodipine.  Patient was instructed to restart amlodipine if not taking.  Patient contacted the office after his visit stating he was taking amlodipine 10 mg.  He was instructed to start hydrochlorothiazide 25 mg daily.  On recheck BMP potassium was low and he was started on K-Dur.  Today, patient ***  BMP?   CAD.  S/p PCI with DES to mid LAD March 2024. Patient denies chest pain, tightness, or pressure. 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 Continue aspirin, Brilinta, carvedilol, as needed SL NTG.*** Hypertension: BP today***. Patient denies headaches, dizziness or vision  changes. Continue carvedilol, amlodipine, hydrochlorothiazide and olmesartan.   Hyperlipidemia.  LDL March 2024 28, at goal.  Continue rosuvastatin.    ROS: All other systems reviewed and are otherwise negative except as noted in History of Present Illness.  Studies Reviewed       ***  Risk Assessment/Calculations    {Does this patient have ATRIAL FIBRILLATION?[phone]} No BP recorded.  {Refresh Note OR Click here to enter BP  :1}***        Physical Exam    VS:  There were no vitals taken for this visit. , BMI There is no height or weight on file to calculate BMI.  GEN: Well nourished, well developed, in no acute distress. Neck: No JVD or carotid bruits. Cardiac: *** RRR. No murmurs. No rubs or gallops.   Respiratory:  Respirations regular and unlabored. Clear to auscultation without rales, wheezing or rhonchi. GI: Soft, nontender, nondistended. Extremities: Radials/DP/PT 2+ and equal bilaterally. No clubbing or cyanosis. No edema ***  Skin: Warm and dry, no rash. Neuro: Strength intact.  Assessment & Plan   ***  Disposition: ***     {Are you ordering a CV Procedure (e.g. stress test, cath, DCCV, TEE, etc)?   Press F2        :[id number]}   Signed, Etta Grandchild. Avalene Sealy, DNP, NP-C

## 2023-04-29 ENCOUNTER — Encounter: Payer: Self-pay | Admitting: Student

## 2023-04-29 ENCOUNTER — Ambulatory Visit: Payer: PPO | Attending: Student | Admitting: Student

## 2023-04-29 VITALS — BP 158/88 | HR 65 | Ht 69.0 in | Wt 227.2 lb

## 2023-04-29 DIAGNOSIS — E785 Hyperlipidemia, unspecified: Secondary | ICD-10-CM | POA: Diagnosis not present

## 2023-04-29 DIAGNOSIS — Z79899 Other long term (current) drug therapy: Secondary | ICD-10-CM | POA: Diagnosis not present

## 2023-04-29 DIAGNOSIS — I2510 Atherosclerotic heart disease of native coronary artery without angina pectoris: Secondary | ICD-10-CM

## 2023-04-29 DIAGNOSIS — I10 Essential (primary) hypertension: Secondary | ICD-10-CM | POA: Diagnosis not present

## 2023-04-29 DIAGNOSIS — E876 Hypokalemia: Secondary | ICD-10-CM

## 2023-04-29 MED ORDER — CARVEDILOL 12.5 MG PO TABS: 12.5000 mg | ORAL_TABLET | Freq: Two times a day (BID) | ORAL | 0 refills | Status: DC

## 2023-04-29 NOTE — Patient Instructions (Addendum)
Medication Instructions:  Increase Carvedilol to 12.5 mg twice a day *If you need a refill on your cardiac medications before your next appointment, please call your pharmacy*   Lab Work: Bmet will be drawn today in office. If you have labs (blood work) drawn today and your tests are completely normal, you will receive your results only by: MyChart Message (if you have MyChart) OR A paper copy in the mail If you have any lab test that is abnormal or we need to change your treatment, we will call you to review the results.   Testing/Procedures: No Testing   Follow-Up: At [facility], you and your health needs are our priority.  As part of our continuing mission to provide you with exceptional heart care, we have created designated Provider Care Teams.  These Care Teams include your primary Cardiologist (physician) and Advanced Practice Providers (APPs -  Physician Assistants and Nurse Practitioners) who all work together to provide you with the care you need, when you need it.  We recommend signing up for the patient portal called "MyChart".  Sign up information is provided on this After Visit Summary.  MyChart is used to connect with patients for Virtual Visits (Telemedicine).  Patients are able to view lab/test results, encounter notes, upcoming appointments, etc.  Non-urgent messages can be sent to your provider as well.   To learn more about what you can do with MyChart, go to [email or web address].    Your next appointment:   6 month(s)  Provider:   Peter Swaziland, MD   Other Instructions Please see hypertension clinic as a new patient with PharmD.

## 2023-04-30 LAB — BASIC METABOLIC PANEL
BUN/Creatinine Ratio: 11 (ref 10–24)
BUN: 9 mg/dL (ref 8–27)
CO2: 27 mmol/L (ref 20–29)
Calcium: 9.7 mg/dL (ref 8.6–10.2)
Chloride: 102 mmol/L (ref 96–106)
Creatinine, Ser: 0.83 mg/dL (ref 0.76–1.27)
Glucose: 115 mg/dL — ABNORMAL HIGH (ref 70–99)
Potassium: 3.4 mmol/L — ABNORMAL LOW (ref 3.5–5.2)
Sodium: 143 mmol/L (ref 134–144)
eGFR: 95 mL/min/{1.73_m2} (ref >59)

## 2023-05-04 ENCOUNTER — Other Ambulatory Visit: Payer: Self-pay | Admitting: Student

## 2023-05-04 MED ORDER — HYDRALAZINE HCL 25 MG PO TABS: 25.0000 mg | ORAL_TABLET | Freq: Two times a day (BID) | ORAL | 1 refills | Status: DC

## 2023-05-15 NOTE — Addendum Note (Signed)
Addended by: Shon Hale on: 05/15/2023 03:11 PM   Modules accepted: Orders

## 2023-05-23 DIAGNOSIS — E876 Hypokalemia: Secondary | ICD-10-CM | POA: Diagnosis not present

## 2023-05-24 LAB — BASIC METABOLIC PANEL
BUN/Creatinine Ratio: 13 (ref 10–24)
BUN: 12 mg/dL (ref 8–27)
CO2: 25 mmol/L (ref 20–29)
Calcium: 9.6 mg/dL (ref 8.6–10.2)
Chloride: 102 mmol/L (ref 96–106)
Creatinine, Ser: 0.91 mg/dL (ref 0.76–1.27)
Glucose: 109 mg/dL — ABNORMAL HIGH (ref 70–99)
Potassium: 3.4 mmol/L — ABNORMAL LOW (ref 3.5–5.2)
Sodium: 141 mmol/L (ref 134–144)
eGFR: 91 mL/min/{1.73_m2} (ref >59)

## 2023-05-26 ENCOUNTER — Ambulatory Visit: Payer: PPO | Attending: Cardiovascular Disease | Admitting: Pharmacist Clinician (PhC)/ Clinical Pharmacy Specialist

## 2023-05-26 VITALS — BP 157/78

## 2023-05-26 DIAGNOSIS — I10 Essential (primary) hypertension: Secondary | ICD-10-CM | POA: Diagnosis not present

## 2023-05-26 MED ORDER — SPIRONOLACTONE 25 MG PO TABS: 25.0000 mg | ORAL_TABLET | Freq: Every day | ORAL | 3 refills | Status: DC

## 2023-05-26 MED ORDER — NITROGLYCERIN 0.4 MG SL SUBL: 0.4000 mg | SUBLINGUAL_TABLET | SUBLINGUAL | 2 refills | Status: AC | PRN

## 2023-05-26 NOTE — Progress Notes (Signed)
Office Visit    Patient Name: Fred [name] Date of Encounter: 05/26/2023  Primary Care Provider:  Swaziland, Peter M, MD Primary Cardiologist:  Peter Swaziland, MD  Chief Complaint    Hypertension  Significant Past Medical History   CAD 3/24 STEMI - DES to mid LAD - on ticagrelor, carvedilol, asa  HLD 6/24 LDL 28 on rosuvastatin 40    No Known Allergies  History of Present Illness    Fred [name] is a 72 y.o. male patient of Dr Peter Swaziland, in the office today for hypertension management.  He was most recently seen by Carlos Levering NP, about 4 weeks ago, at which time his BP was 158/88.  Carvedilol was increased to 12.5 mg bid and he was referred to [facility] hypertension clinic.     Today he is in the office for evaluation.  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  His most recent labs (from Friday) show K still at 3.4 with a message to increase K to daily.  He also notes that he has developed some edema in his legs (L worse than R).  In reviewing his medications, the amlodipine appears to have been filled close to 4 months ago and still has 15 or so tablets remaining.   When he saw Ms Daine Floras he was told to increase the carvedilol to 2 tablets, but mis-understood.  He thought she meant to increase from 6.25 mg once daily to twice.  Since he was taking it twice daily already, he did not make any changes.    Blood Pressure Goal:  130/80  Current Medications:  amlodipine 10 mg every day, carvedilol 12.5 mg bid, hydralazine 25 mg bid, hctz 25 mg every day, olmesartan 40 mg every day   Previously tried:    Family Hx:   father died from MI at 54 93, first was 12 years earlier (dad had low potassium- ?);  mother died old age 29 - pneumonia; sister died at 23 covid;  no biological children  Social Hx:      Tobacco: no  Alcohol: no  Caffeine:  sweet tea - 1 glass per meal, 1 coke/pepsi per day  Diet:   mostly home cooked meals, variety of proteins, vegetables at most meals f/f/c; snakcing some, but limits portions   Exercise:  walks 2.5 miles 4-5 times per week  Home BP readings:  19 readings over past 3 weeks, average 157/78  (range 147-171/72-83)  Adherence Assessment  Do you ever forget to take your medication? [] Yes [x] No  Do you ever skip doses due to side effects? [] Yes [x] No  Do you have trouble affording your medicines? [] Yes [x] No  Are you ever unable to pick up your medication due to transportation difficulties? [] Yes [x] No    Accessory Clinical Findings    Lab Results  Component Value Date   CREATININE 0.91 05/23/2023   BUN 12 05/23/2023   NA 141 05/23/2023   K 3.4 (L) 05/23/2023   CL 102 05/23/2023   CO2 25 05/23/2023   Lab Results  Component Value Date   ALT 36 01/17/2023   AST 30 01/17/2023   ALKPHOS 104 01/17/2023   BILITOT 0.9 01/17/2023   Lab Results  Component Value Date   HGBA1C 5.7 (H) 10/17/2022    Home Medications    Current Outpatient Medications  Medication Sig Dispense  Refill   spironolactone (ALDACTONE) 25 MG tablet Take 1 tablet (25 mg total) by mouth daily. 90 tablet 3   amLODipine (NORVASC) 10 MG tablet Take 10 mg by mouth daily.     aspirin 81 MG tablet Take 81 mg by mouth daily.     carvedilol (COREG) 12.5 MG tablet Take 1 tablet (12.5 mg total) by mouth 2 (two) times daily. (Patient taking differently: Take 6.25 mg by mouth 2 (two) times daily.) 60 tablet 0   fish oil-omega-3 fatty acids 1000 MG capsule Take 1 g 2 (two) times daily after a meal by mouth.      hydrALAZINE (APRESOLINE) 25 MG tablet Take 1 tablet (25 mg total) by mouth in the morning and at bedtime. 60 tablet 1   hydrochlorothiazide (HYDRODIURIL) 25 MG tablet Take 1 tablet (25 mg total) by mouth daily. 90 tablet 3   Multiple Vitamins-Minerals (MULTIVITAMIN WITH MINERALS) tablet Take 1 tablet by mouth daily.      nitroGLYCERIN (NITROSTAT) 0.4 MG SL tablet Place 1 tablet (0.4 mg total) under the tongue every 5 (five) minutes x 3 doses as needed for chest pain. 25 tablet 2   olmesartan (BENICAR) 40 MG tablet Take 1 tablet (40 mg total) by mouth daily. 90 tablet 3   omeprazole (PRILOSEC) 20 MG capsule Take 20 mg by mouth every other day.     potassium chloride (KLOR-CON) 10 MEQ tablet Take 1 tablet (10 mEq total) by mouth daily. 90 tablet 3   rosuvastatin (CRESTOR) 40 MG tablet Take 1 tablet (40 mg total) by mouth daily. 90 tablet 1   ticagrelor (BRILINTA) 90 MG TABS tablet Take 1 tablet (90 mg total) by mouth 2 (two) times daily. 180 tablet 2   No current facility-administered medications for this visit.     Assessment & Plan    Hypertension Assessment: BP is uncontrolled BP 157/78 mmHg average;  above the goal (<130/80). Some LEE developing, L worse than R Tolerates current medications well ; edema only concern Denies SOB, palpitation, chest pain, headaches Did not increase carvedilol - mis-understood directions Reiterated the importance of regular exercise and low salt diet   Plan:  Decrease amlodipine to 5 mg daily and move to evening Continue carvedilol 6.25 mg bid, hydralazine 25 mg bid, olmesartan 40 mg every day, hctz 25 mg qd Start taking spironolactone 25 mg once daily, first dose on day of renin/aldosterone labs Continue taking potassium 10 mEq daily with 10 mEq bid MWF until starts spironolactone, then decrease to 10 mEq daily Patient to keep record of BP readings with heart rate and report to [place] at the next visit Patient to follow up with PharmD in 3 weeks  Labs ordered today:  renin/aldosterone   Phillips Hay PharmD CPP [facility]  [address] [place], [place] [address] [phone]

## 2023-05-26 NOTE — Patient Instructions (Signed)
Follow up appointment: Monday November 25  Go to the lab one morning, first thing, this week to check renin/aldosterone levles  Take your BP meds as follows:  Cut amlodipine from 10 mg to 5 mg once daily (cut the 10 mg tablets in half for now) - and move to evening  Continue with carvedilol 6.25 mg twice daily  Continue with hydralazine 25 mg twice daily  Continue with olmesartan 40 mg daily - take in the mornings  Continue with hydrochlorothiazide 25 mg daily - take in the mornings  Continue with potassium 10 mEq once daily - START AFTER BLOOD WORK IS DRAWN  (until then, take 2 tabs MWF)  Start spironolactone 25 mg once daily in the evenings - START AFTER BLOOD WORK IS DRAWN   Check your blood pressure at home daily (if able) and keep record of the readings.  Hypertension "High blood pressure"  Hypertension is often called "The Silent Killer." It rarely causes symptoms until it is extremely  high or has done damage to other organs in the body. For this reason, you should have your  blood pressure checked regularly by your physician. We will check your blood pressure  every time you see a provider at one of our offices.   Your blood pressure reading consists of two numbers. Ideally, blood pressure should be  below 120/80. The first ("top") number is called the systolic pressure. It measures the  pressure in your arteries as your heart beats. The second ("bottom") number is called the diastolic pressure. It measures the pressure in your arteries as the heart relaxes between beats.  The benefits of getting your blood pressure under control are enormous. A 10-point  reduction in systolic blood pressure can reduce your risk of stroke by 27% and heart failure by 28%  Your blood pressure goal is < 130/80  To check your pressure at home you will need to:  1. Sit up in a chair, with feet flat on the floor and back supported. Do not cross your ankles or legs. 2. Rest your left arm so  that the cuff is about heart level. If the cuff goes on your upper arm,  then just relax the arm on the table, arm of the chair or your lap. If you have a wrist cuff, we  suggest relaxing your wrist against your chest (think of it as Pledging the Flag with the  wrong arm).  3. Place the cuff snugly around your arm, about 1 inch above the crook of your elbow. The  cords should be inside the groove of your elbow.  4. Sit quietly, with the cuff in place, for about 5 minutes. After that 5 minutes press the power  button to start a reading. 5. Do not talk or move while the reading is taking place.  6. Record your readings on a sheet of paper. Although most cuffs have a memory, it is often  easier to see a pattern developing when the numbers are all in front of you.  7. You can repeat the reading after 1-3 minutes if it is recommended  Make sure your bladder is empty and you have not had caffeine or tobacco within the last 30 min  Always bring your blood pressure log with you to your appointments. If you have not brought your monitor in to be double checked for accuracy, please bring it to your next appointment.  You can find a list of quality blood pressure cuffs at [email or web address]

## 2023-05-26 NOTE — Assessment & Plan Note (Signed)
Assessment: BP is uncontrolled BP 157/78 mmHg average;  above the goal (<130/80). Some LEE developing, L worse than R Tolerates current medications well ; edema only concern Denies SOB, palpitation, chest pain, headaches Did not increase carvedilol - mis-understood directions Reiterated the importance of regular exercise and low salt diet   Plan:  Decrease amlodipine to 5 mg daily and move to evening Continue carvedilol 6.25 mg bid, hydralazine 25 mg bid, olmesartan 40 mg every day, hctz 25 mg qd Start taking spironolactone 25 mg once daily, first dose on day of renin/aldosterone labs Continue taking potassium 10 mEq daily with 10 mEq bid MWF until starts spironolactone, then decrease to 10 mEq daily Patient to keep record of BP readings with heart rate and report to [place] at the next visit Patient to follow up with PharmD in 3 weeks  Labs ordered today:  renin/aldosterone

## 2023-05-29 DIAGNOSIS — I10 Essential (primary) hypertension: Secondary | ICD-10-CM | POA: Diagnosis not present

## 2023-06-05 LAB — ALDOSTERONE + RENIN ACTIVITY W/ RATIO
Aldos/Renin Ratio: 13.4 (ref 0.0–30.0)
Aldosterone: 8.2 ng/dL (ref 0.0–30.0)
Renin Activity, Plasma: 0.613 ng/mL/h (ref 0.167–5.380)

## 2023-06-19 DIAGNOSIS — G4730 Sleep apnea, unspecified: Secondary | ICD-10-CM | POA: Diagnosis not present

## 2023-06-19 DIAGNOSIS — I2510 Atherosclerotic heart disease of native coronary artery without angina pectoris: Secondary | ICD-10-CM | POA: Diagnosis not present

## 2023-06-19 DIAGNOSIS — Z1331 Encounter for screening for depression: Secondary | ICD-10-CM | POA: Diagnosis not present

## 2023-06-19 DIAGNOSIS — E782 Mixed hyperlipidemia: Secondary | ICD-10-CM | POA: Diagnosis not present

## 2023-06-19 DIAGNOSIS — Z6834 Body mass index (BMI) 34.0-34.9, adult: Secondary | ICD-10-CM | POA: Diagnosis not present

## 2023-06-19 DIAGNOSIS — E6609 Other obesity due to excess calories: Secondary | ICD-10-CM | POA: Diagnosis not present

## 2023-06-19 DIAGNOSIS — I10 Essential (primary) hypertension: Secondary | ICD-10-CM | POA: Diagnosis not present

## 2023-06-19 DIAGNOSIS — Z0001 Encounter for general adult medical examination with abnormal findings: Secondary | ICD-10-CM | POA: Diagnosis not present

## 2023-06-19 DIAGNOSIS — E119 Type 2 diabetes mellitus without complications: Secondary | ICD-10-CM | POA: Diagnosis not present

## 2023-06-20 DIAGNOSIS — G4733 Obstructive sleep apnea (adult) (pediatric): Secondary | ICD-10-CM | POA: Diagnosis not present

## 2023-06-23 ENCOUNTER — Encounter: Payer: Self-pay | Admitting: Pharmacist Clinician (PhC)/ Clinical Pharmacy Specialist

## 2023-06-23 ENCOUNTER — Ambulatory Visit: Payer: PPO | Attending: Cardiology | Admitting: Pharmacist Clinician (PhC)/ Clinical Pharmacy Specialist

## 2023-06-23 VITALS — BP 146/88 | HR 62

## 2023-06-23 DIAGNOSIS — I10 Essential (primary) hypertension: Secondary | ICD-10-CM

## 2023-06-23 MED ORDER — OLMESARTAN-AMLODIPINE-HCTZ 40-5-25 MG PO TABS: 1.0000 | ORAL_TABLET | Freq: Every day | ORAL | 3 refills | Status: DC

## 2023-06-23 MED ORDER — SPIRONOLACTONE 50 MG PO TABS: 50.0000 mg | ORAL_TABLET | Freq: Every day | ORAL | 3 refills | Status: DC

## 2023-06-23 NOTE — Patient Instructions (Addendum)
Follow up appointment: Thursday January 23 at 8:30 am  Take your BP meds as follows:  STOP potassium supplement. We will combine amlodipine, olmesartan and hydrochlorothiazide into one tablet   Increase spironolactone to 50 mg once daily - I will send in the prescription for 50 mg tablets, but you can take 2 of the 25 mg tablets once daily until gone  Continue with carvedilol and hydralazine, both twice daily.  Check your blood pressure at home daily (if able) and keep record of the readings.  Hypertension "High blood pressure"  Hypertension is often called "The Silent Killer." It rarely causes symptoms until it is extremely  high or has done damage to other organs in the body. For this reason, you should have your  blood pressure checked regularly by your physician. We will check your blood pressure  every time you see a provider at one of our offices.   Your blood pressure reading consists of two numbers. Ideally, blood pressure should be  below 120/80. The first ("top") number is called the systolic pressure. It measures the  pressure in your arteries as your heart beats. The second ("bottom") number is called the diastolic pressure. It measures the pressure in your arteries as the heart relaxes between beats.  The benefits of getting your blood pressure under control are enormous. A 10-point  reduction in systolic blood pressure can reduce your risk of stroke by 27% and heart failure by 28%  Your blood pressure goal is < 130/80  To check your pressure at home you will need to:  1. Sit up in a chair, with feet flat on the floor and back supported. Do not cross your ankles or legs. 2. Rest your left arm so that the cuff is about heart level. If the cuff goes on your upper arm,  then just relax the arm on the table, arm of the chair or your lap. If you have a wrist cuff, we  suggest relaxing your wrist against your chest (think of it as Pledging the Flag with the  wrong arm).   3. Place the cuff snugly around your arm, about 1 inch above the crook of your elbow. The  cords should be inside the groove of your elbow.  4. Sit quietly, with the cuff in place, for about 5 minutes. After that 5 minutes press the power  button to start a reading. 5. Do not talk or move while the reading is taking place.  6. Record your readings on a sheet of paper. Although most cuffs have a memory, it is often  easier to see a pattern developing when the numbers are all in front of you.  7. You can repeat the reading after 1-3 minutes if it is recommended  Make sure your bladder is empty and you have not had caffeine or tobacco within the last 30 min  Always bring your blood pressure log with you to your appointments. If you have not brought your monitor in to be double checked for accuracy, please bring it to your next appointment.  You can find a list of quality blood pressure cuffs at [email or web address].org

## 2023-06-23 NOTE — Assessment & Plan Note (Signed)
Assessment: BP is uncontrolled in office BP 146/88 mmHg;  above the goal (<130/80). Tolerates current medications well without any side effects Denies SOB, palpitation, chest pain, headaches Lower leg swelling decreased with cutting amlodipine dose back to 5 mg  Reiterated the importance of regular exercise and low salt diet   Plan:  Increase spironolactone to 50 mg once daily Combine amlodipine, olmesartan and hctz into single tablet Continue taking carvedilol 6.25 mg bid and hydralazine 25 mg bid Stop potassium supplement Patient to keep record of BP readings with heart rate and report to [place] at the next visit Patient to follow up with PharmD in 2 months  Labs ordered today:  had labs at [facility] last week.  Will check to see what was drawn.  If no BMET, will check in 10 days

## 2023-06-23 NOTE — Progress Notes (Signed)
Office Visit    Patient Name: Fred [name] Date of Encounter: 06/23/2023  Primary Care Provider:  Swaziland, Peter M, MD Primary Cardiologist:  Peter Swaziland, MD  Chief Complaint    Hypertension  Significant Past Medical History   CAD 3/24 STEMI - DES to mid LAD - on ticagrelor, carvedilol, asa  HLD 6/24 LDL 28 on rosuvastatin 40    No Known Allergies  History of Present Illness    Fred [name] is a 72 y.o. male patient of Dr Peter Swaziland, in the office today for hypertension management.  He was most recently seen by Carlos Levering NP, about 4 weeks ago, at which time his BP was 158/88.  Carvedilol was increased to 12.5 mg bid and he was referred to [facility] hypertension clinic.   I saw him last month and pressure was essentially unchanged, at 157/78.  He had not understood directions about increasing carvedilol, and remained on 6.25 mg dose.  Because of his low potassium we tested for hyperaldosteronism and added spironolactone 25 mg.  Because of LEE, amlodipine was cut back to 5 mg daily and moved to evenings.  Renin/aldosterone labs were normal.     Today he is in the office for follow up.  Went for his 2.5 mile walk this morning.  Has no complaints about the addition of spironolactone, but would like to combine meds if possible to cut back on pill burden.    Blood Pressure Goal:  130/80  Current Medications:  amlodipine 5 mg every evening, carvedilol 6.25 mg bid, hydralazine 25 mg bid, hctz 25 mg every day, olmesartan 40 mg every day, spironolactone 25 mg every day   Previously tried:    Family Hx:   father died from MI at 85 25, first was 12 years earlier (dad had low potassium- ?);  mother died old age 71 - pneumonia; sister died at 9 covid;  no biological children  Social Hx:      Tobacco: no  Alcohol: no  Caffeine: sweet tea - 1 glass per meal, 1 coke/pepsi per day  Diet:   mostly home cooked meals, variety of proteins, vegetables at most meals f/f/c; snakcing  some, but limits portions   Exercise:  walks 2.5 miles 4-5 times per week  Home BP readings:    AM 7 readings average 143/77 (range 136-155/68/84)  PM 16 readings average 141/74 (range 121-161/69-79) Improved from last visit, average 157/78  Adherence Assessment  Do you ever forget to take your medication? [] Yes [x] No  Do you ever skip doses due to side effects? [] Yes [x] No  Do you have trouble affording your medicines? [] Yes [x] No  Are you ever unable to pick up your medication due to transportation difficulties? [] Yes [x] No    Accessory Clinical Findings    Lab Results  Component Value Date   CREATININE 0.91 05/23/2023   BUN 12 05/23/2023   NA 141 05/23/2023   K 3.4 (L) 05/23/2023   CL 102 05/23/2023   CO2 25 05/23/2023   Lab Results  Component Value Date   ALT 36 01/17/2023   AST 30 01/17/2023   ALKPHOS 104 01/17/2023   BILITOT 0.9 01/17/2023   Lab Results  Component Value Date   HGBA1C 5.7 (H) 10/17/2022    Home Medications    Current Outpatient Medications  Medication Sig Dispense Refill   amLODipine (NORVASC) 10 MG tablet Take 10 mg by mouth daily.     aspirin 81 MG tablet Take 81 mg by mouth daily.  carvedilol (COREG) 12.5 MG tablet Take 1 tablet (12.5 mg total) by mouth 2 (two) times daily. (Patient taking differently: Take 6.25 mg by mouth 2 (two) times daily.) 60 tablet 0   fish oil-omega-3 fatty acids 1000 MG capsule Take 1 g 2 (two) times daily after a meal by mouth.      hydrALAZINE (APRESOLINE) 25 MG tablet Take 1 tablet (25 mg total) by mouth in the morning and at bedtime. 60 tablet 1   hydrochlorothiazide (HYDRODIURIL) 25 MG tablet Take 1 tablet (25 mg total) by mouth daily. 90 tablet 3   Multiple Vitamins-Minerals (MULTIVITAMIN WITH MINERALS) tablet Take 1 tablet by mouth daily.     nitroGLYCERIN (NITROSTAT) 0.4 MG SL tablet Place 1 tablet (0.4 mg total) under the tongue every 5 (five) minutes x 3 doses as needed for chest pain. 25 tablet 2    olmesartan (BENICAR) 40 MG tablet Take 1 tablet (40 mg total) by mouth daily. 90 tablet 3   omeprazole (PRILOSEC) 20 MG capsule Take 20 mg by mouth every other day.     potassium chloride (KLOR-CON) 10 MEQ tablet Take 1 tablet (10 mEq total) by mouth daily. 90 tablet 3   rosuvastatin (CRESTOR) 40 MG tablet Take 1 tablet (40 mg total) by mouth daily. 90 tablet 1   spironolactone (ALDACTONE) 25 MG tablet Take 1 tablet (25 mg total) by mouth daily. 90 tablet 3   ticagrelor (BRILINTA) 90 MG TABS tablet Take 1 tablet (90 mg total) by mouth 2 (two) times daily. 180 tablet 2   No current facility-administered medications for this visit.     Assessment & Plan    No problem-specific Assessment & Plan notes found for this encounter.   Phillips Hay PharmD CPP [facility]  [address] [place], [place] [address] [phone]

## 2023-06-28 ENCOUNTER — Other Ambulatory Visit: Payer: Self-pay | Admitting: Student

## 2023-06-30 ENCOUNTER — Encounter: Payer: Self-pay | Admitting: Pharmacist Clinician (PhC)/ Clinical Pharmacy Specialist

## 2023-06-30 DIAGNOSIS — I10 Essential (primary) hypertension: Secondary | ICD-10-CM

## 2023-07-04 ENCOUNTER — Other Ambulatory Visit: Payer: Self-pay | Admitting: Pharmacist Clinician (PhC)/ Clinical Pharmacy Specialist

## 2023-07-04 ENCOUNTER — Other Ambulatory Visit (HOSPITAL_COMMUNITY): Payer: Self-pay

## 2023-07-04 ENCOUNTER — Telehealth: Payer: Self-pay | Admitting: Pharmacy Technician

## 2023-07-04 DIAGNOSIS — I10 Essential (primary) hypertension: Secondary | ICD-10-CM | POA: Diagnosis not present

## 2023-07-04 MED ORDER — OLMESARTAN MEDOXOMIL-HCTZ 40-25 MG PO TABS: 1.0000 | ORAL_TABLET | Freq: Every day | ORAL | 3 refills | Status: DC

## 2023-07-04 MED ORDER — AMLODIPINE BESYLATE 5 MG PO TABS: 5.0000 mg | ORAL_TABLET | Freq: Every day | ORAL | 3 refills | Status: AC

## 2023-07-04 NOTE — Telephone Encounter (Signed)
Pharmacy Patient Advocate Encounter   Received notification from  direct message  that prior authorization for amlodipine/olmesartan 10/40 is required/requested.   Insurance verification completed.   The patient is insured through [facility] ADVANTAGE/RX ADVANCE .   Per test claim: The current 07/04/23 day co-pay is, $5.00- one month.  No PA needed at this time. This test claim was processed through [facility]- copay amounts may vary at other pharmacies due to pharmacy/plan contracts, or as the patient moves through the different stages of their insurance plan.

## 2023-07-04 NOTE — Telephone Encounter (Signed)
Pharmacy Patient Advocate Encounter   Received notification from  direct messages  that prior authorization for olmesartan/hydrochlorothiazide 40/25 is required/requested.   Insurance verification completed.   The patient is insured through [facility] ADVANTAGE/RX ADVANCE .   Per test claim: The current 07/04/23 day co-pay is, $0.00 one month.  No PA needed at this time. This test claim was processed through [facility]- copay amounts may vary at other pharmacies due to pharmacy/plan contracts, or as the patient moves through the different stages of their insurance plan.

## 2023-07-05 LAB — BASIC METABOLIC PANEL
BUN/Creatinine Ratio: 12 (ref 10–24)
BUN: 15 mg/dL (ref 8–27)
CO2: 24 mmol/L (ref 20–29)
Calcium: 9.6 mg/dL (ref 8.6–10.2)
Chloride: 102 mmol/L (ref 96–106)
Creatinine, Ser: 1.23 mg/dL (ref 0.76–1.27)
Glucose: 111 mg/dL — ABNORMAL HIGH (ref 70–99)
Potassium: 4.3 mmol/L (ref 3.5–5.2)
Sodium: 141 mmol/L (ref 134–144)
eGFR: 63 mL/min/{1.73_m2} (ref >59)

## 2023-07-10 ENCOUNTER — Other Ambulatory Visit: Payer: Self-pay | Admitting: Nurse Practitioner

## 2023-07-28 ENCOUNTER — Other Ambulatory Visit: Payer: Self-pay | Admitting: Nurse Practitioner

## 2023-08-21 ENCOUNTER — Ambulatory Visit: Payer: PPO | Attending: Internal Medicine | Admitting: Pharmacist

## 2023-08-21 ENCOUNTER — Encounter: Payer: Self-pay | Admitting: Pharmacist

## 2023-08-21 VITALS — BP 122/69 | HR 54

## 2023-08-21 DIAGNOSIS — I10 Essential (primary) hypertension: Secondary | ICD-10-CM | POA: Diagnosis not present

## 2023-08-21 DIAGNOSIS — G4733 Obstructive sleep apnea (adult) (pediatric): Secondary | ICD-10-CM

## 2023-08-21 NOTE — Patient Instructions (Addendum)
It was nice meeting you today  Your blood pressure today looks excellent. We would like it to stay below 130/80  Please continue: Amlodipine 5mg  daily Hydralazine 25mg  twice a day Olmesartan/hydrochlorothiazide 40/25mg  daily Spironolactone 50mg  daily Carvedilol 12.5mg  twice a day  Continue to monitor your blood pressure and heart rate and let us know if you have any issues  Follow up with Dr Swaziland in 2 months  Laural Golden, PharmD, BCACP, CDCES, CPP [address] [place], [place], [address] Phone: [phone], Fax: [phone]

## 2023-08-21 NOTE — Progress Notes (Signed)
Patient ID: Fred [name]                 DOB: [birth year]                      MRN: [id number]     HPI: Fatih Serratore is a 73 y.o. male referred by Dr. Swaziland to HTN clinic. PMH is significant for HTN, STEMI, HLD, and pre DM.  Patient presents today in good spirits. Reports he feels well. Works for [employer] in [place] area.  Has been checking BP at home and reports improvement. Forgot to bring home log. Thinks most recent readings were 125/65 and 117/63. Reports pulse rate at home has been in 60s bpm.  Current HTN meds:  Amlodipine 5mg  daily Hydralazine 25mg  BID Olmesartan/hydrochlorothiazide 40/25mg   Spironolactone 50mg  daily Carvedilol 12.5mg  BID  BP goal: <130/80   Wt Readings from Last 3 Encounters:  04/29/23 227 lb 3.2 oz (103.1 kg)  01/17/23 224 lb (101.6 kg)  10/29/22 228 lb 12.8 oz (103.8 kg)   BP Readings from Last 3 Encounters:  06/23/23 (!) 146/88  05/26/23 (!) 157/78  04/29/23 (!) 158/88   Pulse Readings from Last 3 Encounters:  06/23/23 62  04/29/23 65  01/17/23 (!) 55    Renal function: CrCl cannot be calculated (Patient's most recent lab result is older than the maximum 21 days allowed.).  Past Medical History:  Diagnosis Date   Contact lens/glasses fitting    GERD (gastroesophageal reflux disease)    has also had esoph dilated   Hypertension    Sleep apnea    wears a cpap-has for 5 yr-will wear post op    Current Outpatient Medications on File Prior to Visit  Medication Sig Dispense Refill   amLODipine (NORVASC) 5 MG tablet Take 1 tablet (5 mg total) by mouth daily. 90 tablet 3   aspirin 81 MG tablet Take 81 mg by mouth daily.     carvedilol (COREG) 12.5 MG tablet TAKE 1 TABLET(12.5 MG) BY MOUTH TWICE DAILY 180 tablet 3   fish oil-omega-3 fatty acids 1000 MG capsule Take 1 g 2 (two) times daily after a meal by mouth.      hydrALAZINE (APRESOLINE) 25 MG tablet TAKE 1 TABLET(25 MG) BY MOUTH IN THE MORNING AND AT BEDTIME 180  tablet 3   Multiple Vitamins-Minerals (MULTIVITAMIN WITH MINERALS) tablet Take 1 tablet by mouth daily.     nitroGLYCERIN (NITROSTAT) 0.4 MG SL tablet Place 1 tablet (0.4 mg total) under the tongue every 5 (five) minutes x 3 doses as needed for chest pain. 25 tablet 2   olmesartan-hydrochlorothiazide (BENICAR HCT) 40-25 MG tablet Take 1 tablet by mouth daily. 90 tablet 3   omeprazole (PRILOSEC) 20 MG capsule Take 20 mg by mouth every other day.     rosuvastatin (CRESTOR) 40 MG tablet TAKE 1 TABLET(40 MG) BY MOUTH DAILY 90 tablet 1   spironolactone (ALDACTONE) 50 MG tablet Take 1 tablet (50 mg total) by mouth daily. 90 tablet 3   ticagrelor (BRILINTA) 90 MG TABS tablet TAKE 1 TABLET(90 MG) BY MOUTH TWICE DAILY 180 tablet 0   No current facility-administered medications on file prior to visit.    No Known Allergies   Assessment/Plan:  1. Hypertension -  Patient BP in room today 122/69 which is at goal of <130/80. Pulse rate low however patient denies any symptoms. Pulse rate at home normal. Advised if he begins to feel dizzy/lightheaded/tired to let us know. Will  continue current meds. Follow up with Dr Swaziland in 2 months.  Continue: Amlodipine 5mg  daily Hydralazine 25mg  BID Olmesartan/hydrochlorothiazide 40/25mg   Spironolactone 50mg  daily Carvedilol 12.5mg  BID F/u with Dr Swaziland  Chris Maribell Demeo, PharmD, BCACP, CDCES, CPP [address] [place], [place], [address] Phone: [phone], Fax: [phone]

## 2023-10-09 ENCOUNTER — Other Ambulatory Visit: Payer: Self-pay | Admitting: Nurse Practitioner

## 2023-10-17 NOTE — Progress Notes (Signed)
 Cardiology Clinic Note   Date: 10/20/2023 ID: Everest Brod, DOB 1951/06/15, MRN [id number]  Primary Cardiologist:  Avleen Bordwell Swaz[place]and, MD  Patient Profile    Aaran Enberg is a 73 y.o. male who is seen for follow up CAD. He has a history of HTN, HLD, prediabetes. He has OSA on CPAP.       History of Present Illness    Liberato Stansbery was admitted on 10/17/2022  for STEMI.  Patient presented to the ED with sudden onset of weakness, shortness of breath and was found to have anterolateral STEMI.  He was taken emergently to the Cath Lab and underwent PCI with DES to mid LAD.    On follow up today he feels very well. No chest pain. Only catches his breath when he makes a sudden movement but not with walking. Feels great. BP is well controlled.     ROS: All other systems reviewed and are otherwise negative except as noted in History of Present Illness.  Studies Reviewed    LEFT HEART CATH AND CORONARY ANGIOGRAPHY  Coronary/Graft Acute MI Revascularization   Conclusion      Mid LAD-1 lesion is 25% stenosed.   Mid LAD-2 lesion is 100% stenosed.   A drug-eluting stent was successfully placed using a SYNERGY XD 2.50X20.   Post intervention, there is a 0% residual stenosis.   There is mild to moderate left ventricular systolic dysfunction.   LV end diastolic pressure is moderately elevated.   The left ventricular ejection fraction is 45-50% by visual estimate.   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 Risk factor modification. If no complications may be a candidate for fast track DC   IMPRESSIONS     1. Left ventricular ejection fraction, by estimation, is 60 to 65%. The  left ventricle has normal function. The left ventricle has no regional  wall motion abnormalities. Left ventricular diastolic parameters are  indeterminate.   2. Right  ventricular systolic function is normal. The right ventricular  size is normal. Tricuspid regurgitation signal is inadequate for assessing  PA pressure.   3. Left atrial size was mildly dilated.   4. The mitral valve is normal in structure. No evidence of mitral valve  regurgitation. No evidence of mitral stenosis.   5. The aortic valve is normal in structure. Aortic valve regurgitation is  not visualized. No aortic stenosis is present.   6. The inferior vena cava is normal in size with greater than 50%  respiratory variability, suggesting right atrial pressure of 3 mmHg.      EKG is not ordered today.   Risk Assessment/Calculations               Physical Exam    VS:  BP 130/64   Pulse (!) 53   Ht 5\' 9"  (1.753 m)   Wt 235 lb 3.2 oz (106.7 kg)   SpO2 96%   BMI 34.73 kg/m  , BMI Body mass index is 34.73 kg/m.  GEN: Well nourished, well developed, in no acute distress. Neck: No JVD or carotid bruits. Cardiac:  RRR. No murmurs. No rubs or gallops.   Respiratory:  Respirations regular and unlabored. Clear to auscultation without rales, wheezing or rhonchi. GI: Soft, nontender, nondistended. Extremities: Radials/DP/PT 2+ and equal bilaterally. No clubbing or cyanosis. No edema.  Skin: Warm and dry, no  rash. Neuro: Strength intact.  Lab Results  Component Value Date   WBC 10.2 10/18/2022   HGB 13.9 10/18/2022   HCT 40.8 10/18/2022   PLT 213 10/18/2022   GLUCOSE 111 (H) 07/04/2023   CHOL 82 (L) 01/17/2023   TRIG 94 01/17/2023   HDL 36 (L) 01/17/2023   LDLCALC 28 01/17/2023   ALT 36 01/17/2023   AST 30 01/17/2023   NA 141 07/04/2023   K 4.3 07/04/2023   CL 102 07/04/2023   CREATININE 1.23 07/04/2023   BUN 15 07/04/2023   CO2 24 07/04/2023   INR 1.6 (H) 10/17/2022   HGBA1C 5.7 (H) 10/17/2022     Assessment & Plan    CAD.  S/p PCI with DES to mid LAD March 2024 in setting of STEMI.  OK to stop Brilinta at this point. Continue amlodipine, Coreg, and  statin Hypertension: well controlled on multiple meds. Will update labs today Hyperlipidemia.   on  rosuvastatin. Check labs today.    Disposition:          Signed, Hernan Turnage Sw[place]iland MD, [facility]

## 2023-10-20 ENCOUNTER — Encounter: Payer: Self-pay | Admitting: Cardiology

## 2023-10-20 ENCOUNTER — Ambulatory Visit: Payer: PPO | Attending: Cardiology | Admitting: Cardiology

## 2023-10-20 VITALS — BP 130/64 | HR 53 | Ht 69.0 in | Wt 235.2 lb

## 2023-10-20 DIAGNOSIS — R7303 Prediabetes: Secondary | ICD-10-CM

## 2023-10-20 DIAGNOSIS — I10 Essential (primary) hypertension: Secondary | ICD-10-CM

## 2023-10-20 DIAGNOSIS — G4733 Obstructive sleep apnea (adult) (pediatric): Secondary | ICD-10-CM | POA: Diagnosis not present

## 2023-10-20 DIAGNOSIS — E785 Hyperlipidemia, unspecified: Secondary | ICD-10-CM

## 2023-10-20 DIAGNOSIS — I2510 Atherosclerotic heart disease of native coronary artery without angina pectoris: Secondary | ICD-10-CM

## 2023-10-20 LAB — LIPID PANEL
Chol/HDL Ratio: 2.3 ratio (ref 0.0–5.0)
Cholesterol, Total: 82 mg/dL — ABNORMAL LOW (ref 100–199)
HDL: 35 mg/dL — ABNORMAL LOW (ref >39)
LDL Chol Calc (NIH): 28 mg/dL (ref 0–99)
Triglycerides: 100 mg/dL (ref 0–149)
VLDL Cholesterol Cal: 19 mg/dL (ref 5–40)

## 2023-10-20 LAB — BASIC METABOLIC PANEL
BUN/Creatinine Ratio: 13 (ref 10–24)
BUN: 21 mg/dL (ref 8–27)
CO2: 21 mmol/L (ref 20–29)
Calcium: 9.9 mg/dL (ref 8.6–10.2)
Chloride: 105 mmol/L (ref 96–106)
Creatinine, Ser: 1.61 mg/dL — ABNORMAL HIGH (ref 0.76–1.27)
Glucose: 105 mg/dL — ABNORMAL HIGH (ref 70–99)
Potassium: 4.5 mmol/L (ref 3.5–5.2)
Sodium: 142 mmol/L (ref 134–144)
eGFR: 46 mL/min/{1.73_m2} — ABNORMAL LOW (ref >59)

## 2023-10-20 LAB — HEPATIC FUNCTION PANEL
ALT: 31 IU/L (ref 0–44)
AST: 29 IU/L (ref 0–40)
Albumin: 4.3 g/dL (ref 3.9–4.9)
Alkaline Phosphatase: 64 IU/L (ref 44–121)
Bilirubin Total: 0.7 mg/dL (ref 0.0–1.2)
Bilirubin, Direct: 0.27 mg/dL (ref 0.00–0.40)
Total Protein: 7.3 g/dL (ref 6.0–8.5)

## 2023-10-20 NOTE — Patient Instructions (Signed)
 Medication Instructions:  Stop taking Brilinta Continue all other medications *If you need a refill on your cardiac medications before your next appointment, please call your pharmacy*   Lab Work: Bmet,lipid and hepatic panels today   Testing/Procedures: None ordered   Follow-Up: At [facility], you and your health needs are our priority.  As part of our continuing mission to provide you with exceptional heart care, we have created designated Provider Care Teams.  These Care Teams include your primary Cardiologist (physician) and Advanced Practice Providers (APPs -  Physician Assistants and Nurse Practitioners) who all work together to provide you with the care you need, when you need it.  We recommend signing up for the patient portal called "MyChart".  Sign up information is provided on this After Visit Summary.  MyChart is used to connect with patients for Virtual Visits (Telemedicine).  Patients are able to view lab/test results, encounter notes, upcoming appointments, etc.  Non-urgent messages can be sent to your provider as well.   To learn more about what you can do with MyChart, go to [email or web address].    Your next appointment:  1 year   Call in Dec to schedule March appointment    Provider:  Dr.Jordan

## 2023-10-20 NOTE — Addendum Note (Signed)
 Addended by: Neoma Laming on: 10/20/2023 08:42 AM   Modules accepted: Orders

## 2023-10-21 ENCOUNTER — Telehealth: Payer: Self-pay | Admitting: *Deleted

## 2023-10-21 DIAGNOSIS — Z79899 Other long term (current) drug therapy: Secondary | ICD-10-CM

## 2023-10-21 DIAGNOSIS — I10 Essential (primary) hypertension: Secondary | ICD-10-CM

## 2023-10-21 MED ORDER — OLMESARTAN MEDOXOMIL 40 MG PO TABS: 40.0000 mg | ORAL_TABLET | Freq: Every day | ORAL | 3 refills | Status: AC

## 2023-10-21 NOTE — Telephone Encounter (Signed)
-----   Message from Peter Swaziland sent at 10/21/2023 12:01 PM EDT ----- The following abnormalities are noted:  renal function is worse. Otherwise chemistries are normal. Lipids look great All other values are normal, stable or within acceptable limits. Medication changes / Follow up labs / Other changes or recommendations:   I would switch benicar HCT to just plain Benicar 40 mg daily. Avoid NSAIDs. Hydrate well. Repeat BMET in 3 weeks  Peter [place]and, MD 10/21/2023 12:00 PM

## 2023-10-21 NOTE — Telephone Encounter (Signed)
 Spoke with pt, aware of results and dr Elvis Coil recommendations.  New script sent to the pharmacy  Lab orders mailed to the pt

## 2023-10-23 ENCOUNTER — Other Ambulatory Visit: Payer: Self-pay | Admitting: Nurse Practitioner

## 2023-11-10 DIAGNOSIS — I10 Essential (primary) hypertension: Secondary | ICD-10-CM | POA: Diagnosis not present

## 2023-11-10 DIAGNOSIS — Z79899 Other long term (current) drug therapy: Secondary | ICD-10-CM | POA: Diagnosis not present

## 2023-11-11 LAB — BASIC METABOLIC PANEL WITH GFR
BUN/Creatinine Ratio: 13 (ref 10–24)
BUN: 16 mg/dL (ref 8–27)
CO2: 21 mmol/L (ref 20–29)
Calcium: 9.5 mg/dL (ref 8.6–10.2)
Chloride: 106 mmol/L (ref 96–106)
Creatinine, Ser: 1.22 mg/dL (ref 0.76–1.27)
Glucose: 90 mg/dL (ref 70–99)
Potassium: 4.6 mmol/L (ref 3.5–5.2)
Sodium: 142 mmol/L (ref 134–144)
eGFR: 64 mL/min/{1.73_m2} (ref >59)

## 2023-12-24 ENCOUNTER — Other Ambulatory Visit: Payer: Self-pay | Admitting: Nurse Practitioner

## 2024-06-22 DIAGNOSIS — K635 Polyp of colon: Secondary | ICD-10-CM | POA: Diagnosis not present

## 2024-06-22 DIAGNOSIS — G4733 Obstructive sleep apnea (adult) (pediatric): Secondary | ICD-10-CM | POA: Diagnosis not present

## 2024-06-22 DIAGNOSIS — I2510 Atherosclerotic heart disease of native coronary artery without angina pectoris: Secondary | ICD-10-CM | POA: Diagnosis not present

## 2024-06-22 DIAGNOSIS — E785 Hyperlipidemia, unspecified: Secondary | ICD-10-CM | POA: Diagnosis not present

## 2024-06-22 DIAGNOSIS — Z0000 Encounter for general adult medical examination without abnormal findings: Secondary | ICD-10-CM | POA: Diagnosis not present

## 2024-06-22 DIAGNOSIS — E1159 Type 2 diabetes mellitus with other circulatory complications: Secondary | ICD-10-CM | POA: Diagnosis not present

## 2024-06-22 DIAGNOSIS — I10 Essential (primary) hypertension: Secondary | ICD-10-CM | POA: Diagnosis not present

## 2024-06-22 DIAGNOSIS — Z125 Encounter for screening for malignant neoplasm of prostate: Secondary | ICD-10-CM | POA: Diagnosis not present

## 2024-06-26 ENCOUNTER — Other Ambulatory Visit: Payer: Self-pay | Admitting: Student

## 2024-06-26 ENCOUNTER — Other Ambulatory Visit: Payer: Self-pay | Admitting: Cardiology

## 2024-06-26 DIAGNOSIS — I10 Essential (primary) hypertension: Secondary | ICD-10-CM

## 2024-10-14 ENCOUNTER — Ambulatory Visit: Admitting: Cardiology
